# Patient Record
Sex: Male | Born: 1975 | Race: White | Hispanic: No | Marital: Married | Smoking: Current some day smoker
Health system: Southern US, Community
[De-identification: ages and names within clinical notes are randomized; demographics above are authoritative.]

## PROBLEM LIST (undated history)

## (undated) DIAGNOSIS — K219 Gastro-esophageal reflux disease without esophagitis: Secondary | ICD-10-CM

## (undated) HISTORY — DX: Gastro-esophageal reflux disease without esophagitis: K21.9

## (undated) HISTORY — PX: OTHER SURGICAL HISTORY: SHX169

---

## 2016-01-02 DIAGNOSIS — M9904 Segmental and somatic dysfunction of sacral region: Secondary | ICD-10-CM | POA: Diagnosis not present

## 2016-01-02 DIAGNOSIS — M9905 Segmental and somatic dysfunction of pelvic region: Secondary | ICD-10-CM | POA: Diagnosis not present

## 2016-01-02 DIAGNOSIS — M5116 Intervertebral disc disorders with radiculopathy, lumbar region: Secondary | ICD-10-CM | POA: Diagnosis not present

## 2016-01-02 DIAGNOSIS — M9903 Segmental and somatic dysfunction of lumbar region: Secondary | ICD-10-CM | POA: Diagnosis not present

## 2016-05-08 ENCOUNTER — Ambulatory Visit (INDEPENDENT_AMBULATORY_CARE_PROVIDER_SITE_OTHER): Payer: BLUE CROSS/BLUE SHIELD | Admitting: Family Medicine

## 2016-05-08 VITALS — BP 118/80 | HR 79 | Temp 98.2°F | Resp 18 | Ht 75.0 in | Wt 196.0 lb

## 2016-05-08 DIAGNOSIS — R05 Cough: Secondary | ICD-10-CM

## 2016-05-08 DIAGNOSIS — J329 Chronic sinusitis, unspecified: Secondary | ICD-10-CM | POA: Diagnosis not present

## 2016-05-08 DIAGNOSIS — R059 Cough, unspecified: Secondary | ICD-10-CM

## 2016-05-08 DIAGNOSIS — R0982 Postnasal drip: Secondary | ICD-10-CM

## 2016-05-08 MED ORDER — BENZONATATE 100 MG PO CAPS: 100.0000 mg | ORAL_CAPSULE | Freq: Three times a day (TID) | ORAL | 0 refills | Status: DC | PRN

## 2016-05-08 MED ORDER — GUAIFENESIN ER 1200 MG PO TB12: 1.0000 | ORAL_TABLET | Freq: Two times a day (BID) | ORAL | 1 refills | Status: DC | PRN

## 2016-05-08 NOTE — Progress Notes (Signed)
   Patient ID: Adrian Anderson, male    DOB: 1976-01-19, 40 y.o.   MRN: [id number]  PCP: No PCP Per Patient  Chief Complaint  Patient presents with  . Cough    x2 weeks, yellow phlegm     Subjective:   HPI 40 year old presents for evaluation of cough times two weeks. No fever. Reports a history of seasonal allergies in which he was treated with Zyrtec 10 mg.  No runny nose. Headache started about two days ago has not been consistent and reports the pain as mild. Denies sinus pressure.  Soreness of throat twice within the last two weeks.  His most troublesome symptom is cough and he reports it was worst during the day less bothersome at night.  Social History   Social History  . Marital status: Married    Spouse name: N/A  . Number of children: N/A  . Years of education: N/A   Occupational History  . Not on file.   Social History Main Topics  . Smoking status: Never Smoker  . Smokeless tobacco: Never Used  . Alcohol use Yes  . Drug use: No  . Sexual activity: Not on file   Other Topics Concern  . Not on file   Social History Narrative  . No narrative on file    No family history on file.   Review of Systems SEE HPI   There are no active problems to display for this patient.    Prior to Admission medications   Not on File     Allergies  Allergen Reactions  . Dairy Aid [Lactase] Nausea And Vomiting       Objective:  Physical Exam  Constitutional: He is oriented to person, place, and time. He appears well-developed and well-nourished.  HENT:  Head: Normocephalic and atraumatic.  Right Ear: External ear normal.  Left Ear: External ear normal.  Nose: Nose normal.  Mouth/Throat: Oropharynx is clear and moist.  Eyes: Conjunctivae are normal. Pupils are equal, round, and reactive to light.  Erythematous, dry, nares bilaterally.   Neck: Normal range of motion. Neck supple.  Cardiovascular: Normal rate, regular rhythm, normal heart sounds and intact  distal pulses.   Pulmonary/Chest: Effort normal and breath sounds normal.  Musculoskeletal: Normal range of motion.  Neurological: He is alert and oriented to person, place, and time.  Skin: Skin is warm and dry.  Psychiatric: He has a normal mood and affect. His behavior is normal. Judgment and thought content normal.    Vitals:   05/08/16 1243  BP: 118/80  Pulse: 79  Resp: 18  Temp: 98.2 F (36.8 C)     Assessment & Plan:  1. Cough, likely viral and related to post nasal drainage Benzontate 100-200 mg up to 3 times daily.  2. Post-nasal drainage, likely related to seasonal allergies. Guaifenesin 1200 mg twice daily to thin mucus secretions.  Carroll Sage. Kenton Kingfisher, MSN, FNP-C [facility]

## 2016-05-08 NOTE — Patient Instructions (Addendum)
Continue Zyrtec 10 mg at bedtime. Start benzonatate up to 3 times daily as needed for cough. Take Guaifenesin 1200 mg twice daily as needed to thin mucus. Follow-up if symptoms worse or do not resolve.    IF you received an x-ray today, you will receive an invoice from [facility]. Please contact [facility] at [phone] with questions or concerns regarding your invoice.   IF you received labwork today, you will receive an invoice from [employer]. Please contact [facility] at [phone] with questions or concerns regarding your invoice.   Our billing staff will not be able to assist you with questions regarding bills from these companies.  You will be contacted with the lab results as soon as they are available. The fastest way to get your results is to activate your My Chart account. Instructions are located on the last page of this paperwork. If you have not heard from [place] regarding the results in 2 weeks, please contact this office.     Upper Respiratory Infection, Adult Most upper respiratory infections (URIs) are caused by a virus. A URI affects the nose, throat, and upper air passages. The most common type of URI is often called "the common cold." HOME CARE   Take medicines only as told by your doctor.  Gargle warm saltwater or take cough drops to comfort your throat as told by your doctor.  Use a warm mist humidifier or inhale steam from a shower to increase air moisture. This may make it easier to breathe.  Drink enough fluid to keep your pee (urine) clear or pale yellow.  Eat soups and other clear broths.  Have a healthy diet.  Rest as needed.  Go back to work when your fever is gone or your doctor says it is okay.  You may need to stay home longer to avoid giving your URI to others.  You can also wear a face mask and wash your hands often to prevent spread of the virus.  Use your inhaler more if you have asthma.  Do  not use any tobacco products, including cigarettes, chewing tobacco, or electronic cigarettes. If you need help quitting, ask your doctor. GET HELP IF:  You are getting worse, not better.  Your symptoms are not helped by medicine.  You have chills.  You are getting more short of breath.  You have brown or red mucus.  You have yellow or brown discharge from your nose.  You have pain in your face, especially when you bend forward.  You have a fever.  You have puffy (swollen) neck glands.  You have pain while swallowing.  You have white areas in the back of your throat. GET HELP RIGHT AWAY IF:   You have very bad or constant:  Headache.  Ear pain.  Pain in your forehead, behind your eyes, and over your cheekbones (sinus pain).  Chest pain.  You have long-lasting (chronic) lung disease and any of the following:  Wheezing.  Long-lasting cough.  Coughing up blood.  A change in your usual mucus.  You have a stiff neck.  You have changes in your:  Vision.  Hearing.  Thinking.  Mood. MAKE SURE YOU:   Understand these instructions.  Will watch your condition.  Will get help right away if you are not doing well or get worse.   This information is not intended to replace advice given to you by your health care provider. Make sure you discuss any questions you have with  your health care provider.   Document Released: 01/27/2008 Document Revised: 12/25/2014 Document Reviewed: 11/15/2013 Elsevier Interactive Patient Education [employer].

## 2016-06-10 ENCOUNTER — Ambulatory Visit (INDEPENDENT_AMBULATORY_CARE_PROVIDER_SITE_OTHER): Payer: BLUE CROSS/BLUE SHIELD

## 2016-06-10 ENCOUNTER — Encounter: Payer: Self-pay | Admitting: Family Medicine

## 2016-06-10 ENCOUNTER — Ambulatory Visit (INDEPENDENT_AMBULATORY_CARE_PROVIDER_SITE_OTHER): Payer: BLUE CROSS/BLUE SHIELD | Admitting: Family Medicine

## 2016-06-10 VITALS — BP 140/87 | HR 64 | Ht 76.0 in | Wt 195.0 lb

## 2016-06-10 DIAGNOSIS — J8410 Pulmonary fibrosis, unspecified: Secondary | ICD-10-CM

## 2016-06-10 DIAGNOSIS — R079 Chest pain, unspecified: Secondary | ICD-10-CM | POA: Insufficient documentation

## 2016-06-10 DIAGNOSIS — D229 Melanocytic nevi, unspecified: Secondary | ICD-10-CM

## 2016-06-10 DIAGNOSIS — Z0000 Encounter for general adult medical examination without abnormal findings: Secondary | ICD-10-CM

## 2016-06-10 DIAGNOSIS — R5383 Other fatigue: Secondary | ICD-10-CM | POA: Insufficient documentation

## 2016-06-10 DIAGNOSIS — R0789 Other chest pain: Secondary | ICD-10-CM | POA: Insufficient documentation

## 2016-06-10 HISTORY — DX: Melanocytic nevi, unspecified: D22.9

## 2016-06-10 HISTORY — DX: Chest pain, unspecified: R07.9

## 2016-06-10 HISTORY — DX: Other fatigue: R53.83

## 2016-06-10 NOTE — Patient Instructions (Signed)
Thank you for coming in today. Get fasting labs soon.  Get a chest xray today.  Return in the near future to remove the mole on your left shoulder.  I will let you know the results of the labs and xray ASAP.   Fatigue Fatigue is feeling tired all of the time, a lack of energy, or a lack of motivation. Occasional or mild fatigue is often a normal response to activity or life in general. However, long-lasting (chronic) or extreme fatigue may indicate an underlying medical condition. HOME CARE INSTRUCTIONS  Watch your fatigue for any changes. The following actions may help to lessen any discomfort you are feeling:  Talk to your health care provider about how much sleep you need each night. Try to get the required amount every night.  Take medicines only as directed by your health care provider.  Eat a healthy and nutritious diet. Ask your health care provider if you need help changing your diet.  Drink enough fluid to keep your urine clear or pale yellow.  Practice ways of relaxing, such as yoga, meditation, massage therapy, or acupuncture.  Exercise regularly.   Change situations that cause you stress. Try to keep your work and personal routine reasonable.  Do not abuse illegal drugs.  Limit alcohol intake to no more than 1 drink per day for nonpregnant women and 2 drinks per day for men. One drink equals 12 ounces of beer, 5 ounces of wine, or 1 ounces of hard liquor.  Take a multivitamin, if directed by your health care provider. SEEK MEDICAL CARE IF:   Your fatigue does not get better.  You have a fever.   You have unintentional weight loss or gain.  You have headaches.   You have difficulty:   Falling asleep.  Sleeping throughout the night.  You feel angry, guilty, anxious, or sad.   You are unable to have a bowel movement (constipation).   You skin is dry.   Your legs or another part of your body is swollen.  SEEK IMMEDIATE MEDICAL CARE IF:   You  feel confused.   Your vision is blurry.  You feel faint or pass out.   You have a severe headache.   You have severe abdominal, pelvic, or back pain.   You have chest pain, shortness of breath, or an irregular or fast heartbeat.   You are unable to urinate or you urinate less than normal.   You develop abnormal bleeding, such as bleeding from the rectum, vagina, nose, lungs, or nipples.  You vomit blood.   You have thoughts about harming yourself or committing suicide.   You are worried that you might harm someone else.    This information is not intended to replace advice given to you by your health care provider. Make sure you discuss any questions you have with your health care provider.   Document Released: 06/07/2007 Document Revised: 08/31/2014 Document Reviewed: 12/12/2013 Elsevier Interactive Patient Education [employer].
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
## 2016-06-11 ENCOUNTER — Encounter: Payer: Self-pay | Admitting: Family Medicine

## 2016-06-19 DIAGNOSIS — Z0000 Encounter for general adult medical examination without abnormal findings: Secondary | ICD-10-CM | POA: Diagnosis not present

## 2016-06-19 DIAGNOSIS — R5383 Other fatigue: Secondary | ICD-10-CM | POA: Diagnosis not present

## 2016-06-19 DIAGNOSIS — R079 Chest pain, unspecified: Secondary | ICD-10-CM | POA: Diagnosis not present

## 2016-06-19 LAB — CBC
HCT: 44.9 % (ref 38.5–50.0)
Hemoglobin: 15.2 g/dL (ref 13.2–17.1)
MCH: 31 pg (ref 27.0–33.0)
MCHC: 33.9 g/dL (ref 32.0–36.0)
MCV: 91.6 fL (ref 80.0–100.0)
MPV: 10.2 fL (ref 7.5–12.5)
PLATELETS: 192 10*3/uL (ref 140–400)
RBC: 4.9 MIL/uL (ref 4.20–5.80)
RDW: 12.9 % (ref 11.0–15.0)
WBC: 5 10*3/uL (ref 3.8–10.8)

## 2016-06-19 LAB — HEMOGLOBIN A1C
Hgb A1c MFr Bld: 5.1 % (ref <5.7)
MEAN PLASMA GLUCOSE: 100 mg/dL

## 2016-06-20 LAB — TSH: TSH: 1.35 mIU/L (ref 0.40–4.50)

## 2016-06-20 LAB — COMPLETE METABOLIC PANEL WITH GFR
ALT: 17 U/L (ref 9–46)
AST: 18 U/L (ref 10–40)
Albumin: 4.7 g/dL (ref 3.6–5.1)
Alkaline Phosphatase: 61 U/L (ref 40–115)
BILIRUBIN TOTAL: 0.7 mg/dL (ref 0.2–1.2)
BUN: 11 mg/dL (ref 7–25)
CHLORIDE: 103 mmol/L (ref 98–110)
CO2: 25 mmol/L (ref 20–31)
CREATININE: 1.16 mg/dL (ref 0.60–1.35)
Calcium: 9.8 mg/dL (ref 8.6–10.3)
GFR, Est Non African American: 79 mL/min (ref >=60)
GLUCOSE: 90 mg/dL (ref 65–99)
Potassium: 4.2 mmol/L (ref 3.5–5.3)
SODIUM: 139 mmol/L (ref 135–146)
TOTAL PROTEIN: 7.5 g/dL (ref 6.1–8.1)

## 2016-06-20 LAB — IRON AND TIBC
%SAT: 20 % (ref 15–60)
IRON: 72 ug/dL (ref 50–180)
TIBC: 365 ug/dL (ref 250–425)
UIBC: 293 ug/dL (ref 125–400)

## 2016-06-20 LAB — LIPID PANEL
Cholesterol: 232 mg/dL — ABNORMAL HIGH (ref 125–200)
HDL: 50 mg/dL (ref >=40)
LDL Cholesterol: 152 mg/dL — ABNORMAL HIGH (ref <130)
Total CHOL/HDL Ratio: 4.6 Ratio (ref <=5.0)
Triglycerides: 150 mg/dL — ABNORMAL HIGH (ref <150)
VLDL: 30 mg/dL (ref <30)

## 2016-06-20 LAB — VITAMIN D 25 HYDROXY (VIT D DEFICIENCY, FRACTURES): Vit D, 25-Hydroxy: 34 ng/mL (ref 30–100)

## 2016-06-20 LAB — T4, FREE: Free T4: 1.3 ng/dL (ref 0.8–1.8)

## 2016-06-20 LAB — TESTOSTERONE: Testosterone: 680 ng/dL (ref 250–827)

## 2016-06-20 LAB — T3, FREE: T3 FREE: 3.4 pg/mL (ref 2.3–4.2)

## 2016-06-20 LAB — FERRITIN: FERRITIN: 49 ng/mL (ref 20–345)

## 2016-08-05 ENCOUNTER — Ambulatory Visit: Payer: BLUE CROSS/BLUE SHIELD | Admitting: Family Medicine

## 2017-01-30 ENCOUNTER — Encounter: Payer: Self-pay | Admitting: Emergency Medicine

## 2017-01-30 ENCOUNTER — Emergency Department (INDEPENDENT_AMBULATORY_CARE_PROVIDER_SITE_OTHER): Payer: BLUE CROSS/BLUE SHIELD

## 2017-01-30 ENCOUNTER — Emergency Department
Admission: EM | Admit: 2017-01-30 | Discharge: 2017-01-30 | Disposition: A | Discharge status: Home / Self Care | Payer: BLUE CROSS/BLUE SHIELD | Source: Home / Self Care | Attending: Family Medicine | Admitting: Family Medicine

## 2017-01-30 DIAGNOSIS — M25511 Pain in right shoulder: Secondary | ICD-10-CM | POA: Diagnosis not present

## 2017-01-30 DIAGNOSIS — S4991XA Unspecified injury of right shoulder and upper arm, initial encounter: Secondary | ICD-10-CM | POA: Diagnosis not present

## 2017-01-30 DIAGNOSIS — S4351XA Sprain of right acromioclavicular joint, initial encounter: Secondary | ICD-10-CM

## 2017-01-30 DIAGNOSIS — S80211A Abrasion, right knee, initial encounter: Secondary | ICD-10-CM

## 2017-01-30 DIAGNOSIS — S40211A Abrasion of right shoulder, initial encounter: Secondary | ICD-10-CM

## 2017-01-30 MED ORDER — HYDROCODONE-ACETAMINOPHEN 5-325 MG PO TABS: 1.0000 | ORAL_TABLET | Freq: Four times a day (QID) | ORAL | 0 refills | Status: DC | PRN

## 2017-01-30 MED ORDER — MELOXICAM 15 MG PO TABS: 15.0000 mg | ORAL_TABLET | Freq: Every day | ORAL | 0 refills | Status: DC
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
## 2017-01-30 NOTE — Discharge Instructions (Signed)
Apply ice pack for 20 to 30 minutes, 3 to 4 times daily  Continue until pain and swelling decrease.  Begin range of motion and stretching exercises as tolerated.  Change bandages daily and apply Bacitracin ointment to wounds until healed.  Keep wounds clean and dry.
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
## 2017-02-12 ENCOUNTER — Ambulatory Visit (INDEPENDENT_AMBULATORY_CARE_PROVIDER_SITE_OTHER): Payer: BLUE CROSS/BLUE SHIELD | Admitting: Family Medicine

## 2017-02-12 ENCOUNTER — Encounter: Payer: Self-pay | Admitting: Family Medicine

## 2017-02-12 DIAGNOSIS — S43004A Unspecified dislocation of right shoulder joint, initial encounter: Secondary | ICD-10-CM | POA: Diagnosis not present

## 2017-02-12 HISTORY — DX: Unspecified dislocation of right shoulder joint, initial encounter: S43.004A
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
## 2017-02-12 NOTE — Patient Instructions (Addendum)
Thank you for coming in today. Return as needed if not better.  Return as needed.    Shoulder Separation A shoulder separation (acromioclavicular separation) is an injury to the connecting tissue (ligament) between the top of your shoulder blade (acromion) and your collarbone (clavicle). The ligament may be stretched, partially torn, or completely torn.  A stretched ligament may not cause very much pain, and it does not move the collarbone out of place. A stretched ligament looks normal on an X-ray.  An injury that is a bit worse may partially tear a ligament and move the collarbone slightly out of place.  A serious injury completely tears both shoulder ligaments. This moves the collarbone severely out of position and changes the way that the shoulder looks (deformity).  What are the causes? The most common cause of a shoulder separation is falling on or receiving a blow to the top of the shoulder. Falling with an outstretched arm may also cause this injury. What increases the risk? You may be at greater risk of a shoulder separation if:  You are male.  You are younger than age 32.  You play a contact sport, such as football or hockey.  What are the signs or symptoms? The most common symptom of a shoulder separation is pain on the top of the shoulder after falling on it or receiving a blow to it. Other signs and symptoms include:  Shoulder deformity.  Swelling of the shoulder.  Decreased ability to move the shoulder.  Bruising on top of the shoulder.  How is this diagnosed? Your health care provider may suspect a shoulder separation based on your symptoms and the details of a recent injury. A physical exam will be done. During this exam, the health care provider may:  Press on your shoulder.  Test the movement of your shoulder.  Ask you to hold a weight in your hand to see if the separation increases.  Do an X-ray.  How is this treated?  A stretch injury may require  only a sling, pain medicine, and cold packs. This treatment may last for 2-12 weeks. You may also have physical therapy. A physical therapist will teach you to do daily exercises to strengthen your shoulder muscles and prevent stiffness.  A complete tear may require surgery to repair the torn ligament. After surgery, you will also require a sling, pain medicine, and cold packs. Recovery may take longer. You may also need more physical therapy. Follow these instructions at home:  Take medicines only as directed by your health care provider.  Apply ice to the top of your shoulder: ? Put ice in a plastic bag. ? Place a towel between your skin and the bag. ? Leave the ice on for 20 minutes, 2-3 times a day.  Wear your sling or splint as directed by your health care provider. ? You may be able to remove your sling to do your physical therapy exercises. ? Ask your health care provider when you can stop wearing the sling.  Do not do any activities that make your pain worse.  Do not lift anything that is heavier than 10 lb (4.5 kg) on the injured side of your body.  Ask your health care provider when you can return to athletic activities. Contact a health care provider if:  Your pain medicine is not relieving your pain.  Your pain and stiffness are not improving after 2 weeks.  You are unable to do your physical therapy exercises because of pain  or stiffness. This information is not intended to replace advice given to you by your health care provider. Make sure you discuss any questions you have with your health care provider. Document Released: 05/20/2005 Document Revised: 04/05/2016 Document Reviewed: 01/10/2014 Elsevier Interactive Patient Education  [employer].

## 2017-02-22 ENCOUNTER — Ambulatory Visit: Payer: Self-pay | Admitting: Family Medicine

## 2017-04-14 ENCOUNTER — Emergency Department
Admission: EM | Admit: 2017-04-14 | Discharge: 2017-04-14 | Disposition: A | Discharge status: Home / Self Care | Payer: BLUE CROSS/BLUE SHIELD | Source: Home / Self Care

## 2017-04-14 ENCOUNTER — Encounter: Payer: Self-pay | Admitting: *Deleted

## 2017-04-14 DIAGNOSIS — J029 Acute pharyngitis, unspecified: Secondary | ICD-10-CM | POA: Diagnosis not present

## 2017-04-14 LAB — POCT RAPID STREP A (OFFICE): Rapid Strep A Screen: NEGATIVE

## 2017-04-14 NOTE — ED Triage Notes (Signed)
Pt c/o sore throat and HA x 2 days. Denies fever.

## 2017-04-14 NOTE — ED Provider Notes (Signed)
[facility]    CSN: [id number] Arrival date & time: 04/14/17  1342     History   Chief Complaint Chief Complaint  Patient presents with  . Sore Throat    HPI Davit Vassar is a 41 y.o. male.   HPI Verlie Hellenbrand is a 41 y.o. male presenting to UC with c/o sore throat and generalized headache for 2 days.  Pain is mild at this time but worse with swallowing. Denies fever, chills, cough or congestion.  He has been traveling a lot for work recently and wants to make sure he does not have strep.    History reviewed. No pertinent past medical history.  Patient Active Problem List   Diagnosis Date Noted  . Shoulder separation, right, initial encounter 02/12/2017  . Chest pain 06/10/2016  . Fatigue 06/10/2016  . Nevus 06/10/2016    Past Surgical History:  Procedure Laterality Date  . 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 Authorizing Provider  HYDROcodone-acetaminophen (NORCO/VICODIN) 5-325 MG tablet Take 1 tablet by mouth every 6 (six) hours as needed for moderate pain. 01/30/17   Kandra Nicolas, MD  meloxicam (MOBIC) 15 MG tablet Take 1 tablet (15 mg total) by mouth daily. Take with food. 01/30/17   Kandra Nicolas, MD    Family History Family History  Problem Relation Age of Onset  . Hypertension Mother   . Cancer Father     Social History Social History  Substance Use Topics  . Smoking status: Never Smoker  . Smokeless tobacco: Never Used  . Alcohol use Yes     Comment: socially      Allergies   Dairy aid [lactase]   Review of Systems Review of Systems  Constitutional: Negative for chills and fever.  HENT: Positive for sore throat. Negative for congestion, ear pain, trouble swallowing and voice change.   Respiratory: Negative for cough and shortness of breath.   Cardiovascular: Negative for chest pain and palpitations.    Gastrointestinal: Negative for abdominal pain, diarrhea, nausea and vomiting.  Musculoskeletal: Negative for arthralgias, back pain and myalgias.  Skin: Negative for rash.  Neurological: Positive for headaches. Negative for dizziness and light-headedness.     Physical Exam Triage Vital Signs ED Triage Vitals  Enc Vitals Group     BP 04/14/17 1417 137/80     Pulse Rate 04/14/17 1417 66     Resp 04/14/17 1417 16     Temp 04/14/17 1417 98 F (36.7 C)     Temp Source 04/14/17 1417 Oral     SpO2 04/14/17 1417 98 %     Weight 04/14/17 1418 190 lb (86.2 kg)     Height 04/14/17 1418 6\' 5"  (1.956 m)     Head Circumference --      Peak Flow --      Pain Score 04/14/17 1418 0     Pain Loc --      Pain Edu? --      Excl. in [place]? --    No data found.   Updated Vital Signs BP 137/80 (BP Location: Left Arm)   Pulse 66   Temp 98 F (36.7 C) (Oral)   Resp 16   Ht 6\' 5"  (1.956 m)   Wt 190 lb (86.2 kg)   SpO2 98%   BMI 22.53 kg/m  Visual Acuity Right Eye Distance:   Left Eye Distance:   Bilateral Distance:    Right Eye Near:   Left Eye Near:    Bilateral Near:     Physical Exam  Constitutional: He is oriented to person, place, and time. He appears well-developed and well-nourished. No distress.  HENT:  Head: Normocephalic and atraumatic.  Right Ear: Tympanic membrane normal.  Left Ear: Tympanic membrane normal.  Nose: Nose normal.  Mouth/Throat: Uvula is midline and mucous membranes are normal. Posterior oropharyngeal erythema present. No oropharyngeal exudate, posterior oropharyngeal edema or tonsillar abscesses.  Eyes: EOM are normal.  Neck: Normal range of motion. Neck supple.  Cardiovascular: Normal rate and regular rhythm.   Pulmonary/Chest: Effort normal and breath sounds normal. No stridor. No respiratory distress. He has no wheezes. He has no rales.  Musculoskeletal: Normal range of motion.  Lymphadenopathy:    He has no cervical adenopathy.  Neurological: He  is alert and oriented to person, place, and time.  Skin: Skin is warm and dry. He is not diaphoretic.  Psychiatric: He has a normal mood and affect. His behavior is normal.  Nursing note and vitals reviewed.    UC Treatments / Results  Labs (all labs ordered are listed, but only abnormal results are displayed) Labs Reviewed  STREP A DNA PROBE  POCT RAPID STREP A (OFFICE)    EKG  EKG Interpretation None       Radiology No results found.  Procedures Procedures (including critical care time)  Medications Ordered in UC Medications - No data to display   Initial Impression / Assessment and Plan / UC Course  I have reviewed the triage vital signs and the nursing notes.  Pertinent labs & imaging results that were available during my care of the patient were reviewed by me and considered in my medical decision making (see chart for details).     Pt c/o generalized headache and mild sore throat. Rapid strep: NEGATIVE Culture sent given lack of other URI symptoms.  Final Clinical Impressions(s) / UC Diagnoses   Final diagnoses:  Pharyngitis, unspecified etiology   Encouraged symptomatic treatment of fluids, rest, acetaminophen, and ibuprofen F/u with PCP in 1 week as needed.   New Prescriptions Discharge Medication List as of 04/14/2017  2:23 PM       Controlled Substance Prescriptions [place] Controlled Substance Registry consulted? Not Applicable   Tyrell Antonio 04/14/17 1556

## 2017-04-14 NOTE — Discharge Instructions (Signed)
°  You may take 500mg acetaminophen every 4-6 hours or in combination with ibuprofen 400-600mg every 6-8 hours as needed for pain, inflammation, and fever. ° °Be sure to drink at least eight 8oz glasses of water to stay well hydrated and get at least 8 hours of sleep at night, preferably more while sick.  ° °
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
## 2019-10-06 ENCOUNTER — Ambulatory Visit: Payer: BC Managed Care – PPO | Attending: Internal Medicine

## 2019-10-06 DIAGNOSIS — Z20822 Contact with and (suspected) exposure to covid-19: Secondary | ICD-10-CM | POA: Diagnosis not present

## 2019-10-08 LAB — NOVEL CORONAVIRUS, NAA: SARS-CoV-2, NAA: NOT DETECTED

## 2019-11-08 ENCOUNTER — Telehealth (INDEPENDENT_AMBULATORY_CARE_PROVIDER_SITE_OTHER): Payer: BC Managed Care – PPO | Admitting: Family Medicine

## 2019-11-08 ENCOUNTER — Encounter: Payer: Self-pay | Admitting: Family Medicine

## 2019-11-08 DIAGNOSIS — R1013 Epigastric pain: Secondary | ICD-10-CM

## 2019-11-08 HISTORY — DX: Epigastric pain: R10.13

## 2019-11-08 MED ORDER — PANTOPRAZOLE SODIUM 40 MG PO TBEC: 40.0000 mg | DELAYED_RELEASE_TABLET | Freq: Every day | ORAL | 0 refills | Status: DC

## 2019-11-08 NOTE — Progress Notes (Signed)
Called patient to start the visit and it went to VM. This is the first attempt.  Stomach pain comes and goes, 2 weeks on and off, He has had this in the past and it was coming and going.  Denies N/V, Avoids dairy.  Eat natural Yogurt,  Trying a bland diet Herbal tea.  [place] food and salty helps some as well.  1 or 2 weeks ago he felt nauseated had an episode and took Theraflu and thought he may have.

## 2019-11-08 NOTE — Progress Notes (Signed)
Adrian Anderson - 44 y.o. male MRN [id number]  Date of birth: 1976-06-24   This visit type was conducted due to national recommendations for restrictions regarding the COVID-19 Pandemic (e.g. social distancing).  This format is felt to be most appropriate for this patient at this time.  All issues noted in this document were discussed and addressed.  No physical exam was performed (except for noted visual exam findings with Video Visits).  I discussed the limitations of evaluation and management by telemedicine and the availability of in person appointments. The patient expressed understanding and agreed to proceed.  I connected with@ on 11/08/19 at 10:10 AM EDT by a video enabled telemedicine application and verified that I am speaking with the correct person using two identifiers.  Present at visit: Luetta Nutting, DO Molly Maduro   Patient Location: Home [place] [address]   Provider location:   [facility]  Chief Complaint  Patient presents with  . Abdominal Pain    HPI  Adrian Anderson is a 44 y.o. male who presents via audio/video conferencing for a telehealth visit today.  He has complaint of abdominal pain.  Pain is located in epigastric area.  Symptoms started about 2 weeks ago.  He has had some nausea without vomiting.  Eating tends to improve symptoms. He does admit to increased EtOH and coffee intake during recent business trip before symptoms started.  He has tried ibuprofen and theraflu without much improvement.    ROS:  A comprehensive ROS was completed and negative except as noted per HPI  No past medical history on file.  Past Surgical History:  Procedure Laterality Date  . 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 Hypertension Mother   . Cancer Father     Social History   Socioeconomic History  . Marital status: Married    Spouse name: Not on file  . Number of children: Not on file  .  Years of education: Not on file  . Highest education level: Not on file  Occupational History  . Not on file  Tobacco Use  . Smoking status: Never Smoker  . Smokeless tobacco: Never Used  Substance and Sexual Activity  . Alcohol use: Yes    Comment: socially   . Drug use: No  . Sexual activity: Yes    Partners: Female  Other Topics Concern  . Not on file  Social History Narrative  . Not on file   Social Determinants of Health   Financial Resource Strain:   . Difficulty of Paying Living Expenses:   Food Insecurity:   . Worried About Charity fundraiser in the Last Year:   . Arboriculturist in the Last Year:   Transportation Needs:   . Film/video editor (Medical):   Marland Kitchen Lack of Transportation (Non-Medical):   Physical Activity:   . Days of Exercise per Week:   . Minutes of Exercise per Session:   Stress:   . Feeling of Stress :   Social Connections:   . Frequency of Communication with Friends and Family:   . Frequency of Social Gatherings with Friends and Family:   . Attends Religious Services:   . Active Member of Clubs or Organizations:   . Attends Archivist Meetings:   Marland Kitchen Marital Status:   Intimate Partner Violence:   . Fear of Current or Ex-Partner:   .  Emotionally Abused:   Marland Kitchen Physically Abused:   . Sexually Abused:      Current Outpatient Medications:  .  ibuprofen (ADVIL) 100 MG tablet, Take 100 mg by mouth every 8 (eight) hours as needed for pain or fever., Disp: , Rfl:  .  pantoprazole (PROTONIX) 40 MG tablet, Take 1 tablet (40 mg total) by mouth daily., Disp: 30 tablet, Rfl: 0  EXAM:  VITALS per patient if applicable: BP 123XX123   Ht 6\' 4"  (1.93 m)   Wt 190 lb (86.2 kg)   BMI 23.13 kg/m   GENERAL: alert, oriented, appears well and in no acute distress  HEENT: atraumatic, conjunttiva clear, no obvious abnormalities on inspection of external nose and ears  NECK: normal movements of the head and neck  LUNGS: on inspection no signs of  respiratory distress, breathing rate appears normal, no obvious gross SOB, gasping or wheezing  CV: no obvious cyanosis  MS: moves all visible extremities without noticeable abnormality  PSYCH/NEURO: pleasant and cooperative, no obvious depression or anxiety, speech and thought processing grossly intact  ASSESSMENT AND PLAN:  Discussed the following assessment and plan:  Epigastric pain Current symptoms consistent with gastritis.  Start protonix 40mg  daily.  Avoid NSAIDS and limit caffeine/coffee and EtOH for now.  Follow up in 2 weeks to assess for improvement.  If not improving will check H. Pylori.   Meds ordered this encounter  Medications  . pantoprazole (PROTONIX) 40 MG tablet    Sig: Take 1 tablet (40 mg total) by mouth daily.    Dispense:  30 tablet    Refill:  0   30 minutes spent including pre visit preparation, review of prior notes and labs, encounter with patient via video visit and same day documentation.  I discussed the assessment and treatment plan with the patient. The patient was provided an opportunity to ask questions and all were answered. The patient agreed with the plan and demonstrated an understanding of the instructions.   The patient was advised to call back or seek an in-person evaluation if the symptoms worsen or if the condition fails to improve as anticipated.    Luetta Nutting, DO

## 2019-11-08 NOTE — Assessment & Plan Note (Signed)
Current symptoms consistent with gastritis.  Start protonix 40mg  daily.  Avoid NSAIDS and limit caffeine/coffee and EtOH for now.  Follow up in 2 weeks to assess for improvement.  If not improving will check H. Pylori.

## 2020-05-22 DIAGNOSIS — Z20822 Contact with and (suspected) exposure to covid-19: Secondary | ICD-10-CM | POA: Diagnosis not present

## 2020-06-24 DIAGNOSIS — R6882 Decreased libido: Secondary | ICD-10-CM

## 2020-06-24 HISTORY — DX: Decreased libido: R68.82

## 2020-08-27 ENCOUNTER — Other Ambulatory Visit: Payer: Self-pay | Admitting: Family Medicine

## 2020-08-27 ENCOUNTER — Encounter: Payer: Self-pay | Admitting: Family Medicine

## 2020-08-27 DIAGNOSIS — R6882 Decreased libido: Secondary | ICD-10-CM

## 2020-08-27 DIAGNOSIS — Z0000 Encounter for general adult medical examination without abnormal findings: Secondary | ICD-10-CM

## 2020-08-27 DIAGNOSIS — R5383 Other fatigue: Secondary | ICD-10-CM

## 2020-08-27 DIAGNOSIS — Z13220 Encounter for screening for lipoid disorders: Secondary | ICD-10-CM

## 2020-08-29 LAB — CBC: HCT: 42.2 % (ref 38.5–50.0)

## 2020-08-29 LAB — COMPLETE METABOLIC PANEL WITH GFR
BUN: 15 mg/dL (ref 7–25)
Glucose, Bld: 92 mg/dL (ref 65–99)

## 2020-08-29 LAB — LIPID PANEL: HDL: 52 mg/dL (ref >=40)

## 2020-08-30 LAB — VITAMIN D 25 HYDROXY (VIT D DEFICIENCY, FRACTURES): Vit D, 25-Hydroxy: 45 ng/mL (ref 30–100)

## 2020-08-30 LAB — LIPID PANEL
Cholesterol: 210 mg/dL — ABNORMAL HIGH (ref <200)
LDL Cholesterol (Calc): 136 mg/dL (calc) — ABNORMAL HIGH
Non-HDL Cholesterol (Calc): 158 mg/dL (calc) — ABNORMAL HIGH (ref <130)
Total CHOL/HDL Ratio: 4 (calc) (ref <5.0)
Triglycerides: 116 mg/dL (ref <150)

## 2020-08-30 LAB — COMPLETE METABOLIC PANEL WITH GFR
AG Ratio: 1.8 (calc) (ref 1.0–2.5)
ALT: 13 U/L (ref 9–46)
AST: 19 U/L (ref 10–40)
Albumin: 4.5 g/dL (ref 3.6–5.1)
Alkaline phosphatase (APISO): 69 U/L (ref 36–130)
CO2: 33 mmol/L — ABNORMAL HIGH (ref 20–32)
Calcium: 9.6 mg/dL (ref 8.6–10.3)
Chloride: 98 mmol/L (ref 98–110)
Creat: 1.17 mg/dL (ref 0.60–1.35)
GFR, Est African American: 87 mL/min/{1.73_m2} (ref >=60)
GFR, Est Non African American: 75 mL/min/{1.73_m2} (ref >=60)
Globulin: 2.5 g/dL (calc) (ref 1.9–3.7)
Potassium: 4.2 mmol/L (ref 3.5–5.3)
Sodium: 137 mmol/L (ref 135–146)
Total Bilirubin: 0.8 mg/dL (ref 0.2–1.2)
Total Protein: 7 g/dL (ref 6.1–8.1)

## 2020-08-30 LAB — CBC
Hemoglobin: 14.5 g/dL (ref 13.2–17.1)
MCH: 31.6 pg (ref 27.0–33.0)
MCHC: 34.4 g/dL (ref 32.0–36.0)
MCV: 91.9 fL (ref 80.0–100.0)
MPV: 9.6 fL (ref 7.5–12.5)
Platelets: 188 10*3/uL (ref 140–400)
RBC: 4.59 10*6/uL (ref 4.20–5.80)
RDW: 11.9 % (ref 11.0–15.0)
WBC: 4.3 10*3/uL (ref 3.8–10.8)

## 2020-08-30 LAB — VITAMIN B12: Vitamin B-12: 506 pg/mL (ref 200–1100)

## 2020-08-30 LAB — TSH: TSH: 1.14 mIU/L (ref 0.40–4.50)

## 2020-08-30 LAB — TESTOSTERONE: Testosterone: 566 ng/dL (ref 250–827)

## 2020-09-03 ENCOUNTER — Other Ambulatory Visit: Payer: Self-pay

## 2020-09-03 ENCOUNTER — Ambulatory Visit (INDEPENDENT_AMBULATORY_CARE_PROVIDER_SITE_OTHER): Payer: BC Managed Care – PPO | Admitting: Family Medicine

## 2020-09-03 ENCOUNTER — Encounter: Payer: Self-pay | Admitting: Family Medicine

## 2020-09-03 VITALS — BP 136/77 | HR 70 | Temp 97.6°F | Ht 76.97 in | Wt 197.0 lb

## 2020-09-03 DIAGNOSIS — J310 Chronic rhinitis: Secondary | ICD-10-CM

## 2020-09-03 DIAGNOSIS — J342 Deviated nasal septum: Secondary | ICD-10-CM | POA: Diagnosis not present

## 2020-09-03 DIAGNOSIS — Z0000 Encounter for general adult medical examination without abnormal findings: Secondary | ICD-10-CM

## 2020-09-03 DIAGNOSIS — R6882 Decreased libido: Secondary | ICD-10-CM | POA: Diagnosis not present

## 2020-09-03 HISTORY — DX: Encounter for general adult medical examination without abnormal findings: Z00.00

## 2020-09-03 HISTORY — DX: Chronic rhinitis: J31.0

## 2020-09-03 MED ORDER — TADALAFIL 5 MG PO TABS: 5.0000 mg | ORAL_TABLET | ORAL | 6 refills | Status: DC | PRN

## 2020-09-03 NOTE — Assessment & Plan Note (Signed)
Referral placed to ENT for deviated septum

## 2020-09-03 NOTE — Patient Instructions (Signed)

## 2020-09-03 NOTE — Progress Notes (Signed)
[name] - 45 y.o. male MRN [id number]  Date of birth: 18-Dec-1975  Subjective Chief Complaint  Patient presents with  . Annual Exam    HPI Adrian Anderson is a 45 y.o. male here today for annual exam.  He has been in good health.  Has concerns today about decreased libido.  Recent labs including testosterone levels were normal.  He reports that he is currently going through a divorce and this may be contributing to his symptoms.    Also reports chronic rhinitis and congestion.  Has tried sinus rinses with mild improvement.   I reviewed his recent labs with him.  Cholesterol mildly elevated.  Discussed dietary changes.  He remains active and cycles for exercise.   He is a non-smoker. He consumes EtOH socially.   He has had COVID vaccine and booster He has had flu vaccine.   Review of Systems  Constitutional: Negative for chills, fever, malaise/fatigue and weight loss.  HENT: Negative for congestion, ear pain and sore throat.   Eyes: Negative for blurred vision, double vision and pain.  Respiratory: Negative for cough and shortness of breath.   Cardiovascular: Negative for chest pain and palpitations.  Gastrointestinal: Negative for abdominal pain, blood in stool, constipation, heartburn and nausea.  Genitourinary: Negative for dysuria and urgency.  Musculoskeletal: Negative for joint pain and myalgias.  Neurological: Negative for dizziness and headaches.  Endo/Heme/Allergies: Does not bruise/bleed easily.  Psychiatric/Behavioral: Negative for depression. The patient is not nervous/anxious and does not have insomnia.     Allergies  Allergen Reactions  . Dairy Aid [Lactase] Nausea And Vomiting    History reviewed. No pertinent past medical history.  Past Surgical History:  Procedure Laterality Date  . 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 Marital status: Married    Spouse name: Not on file  . Number of  children: Not on file  . Years of education: Not on file  . Highest education level: Not on file  Occupational History  . Not on file  Tobacco Use  . Smoking status: Never Smoker  . Smokeless tobacco: Never Used  Vaping Use  . Vaping Use: Never used  Substance and Sexual Activity  . Alcohol use: Yes    Comment: socially   . Drug use: No  . Sexual activity: Yes    Partners: Female  Other Topics Concern  . Not on file  Social History Narrative  . Not on file   Social Determinants of Health   Financial Resource Strain: Not on file  Food Insecurity: Not on file  Transportation Needs: Not on file  Physical Activity: Not on file  Stress: Not on file  Social Connections: Not on file    Family History  Problem Relation Age of Onset  . Hypertension Mother   . Cancer Father     Health Maintenance  Topic Date Due  . Hepatitis C Screening  Never done  . TETANUS/TDAP  08/24/2022  . COVID-19 Vaccine  Completed  . HIV Screening  Completed  . INFLUENZA VACCINE  Discontinued     ----------------------------------------------------------------------------------------------------------------------------------------------------------------------------------------------------------------- Physical Exam BP 136/77 (BP Location: Left Arm, Patient Position: Sitting, Cuff Size: Normal)   Pulse 70   Temp 97.6 F (36.4 C)   Ht 6' 4.97" (1.955 m)   Wt 197 lb (89.4 kg)   SpO2 100%   BMI 23.38 kg/m   Physical Exam Constitutional:  General: He is not in acute distress.    Appearance: He is well-nourished.  HENT:     Head: Normocephalic and atraumatic.     Right Ear: External ear normal.     Left Ear: External ear normal.     Nose:     Comments: Septal deviation to the Right.     Mouth/Throat:     Mouth: Oropharynx is clear and moist.  Eyes:     General: No scleral icterus. Neck:     Thyroid: No thyromegaly.  Cardiovascular:     Rate and Rhythm: Normal rate and regular  rhythm.     Pulses: Intact distal pulses.     Heart sounds: Normal heart sounds.  Pulmonary:     Effort: Pulmonary effort is normal.     Breath sounds: Normal breath sounds.  Abdominal:     General: Bowel sounds are normal. There is no distension.     Palpations: Abdomen is soft.     Tenderness: There is no abdominal tenderness. There is no guarding.  Musculoskeletal:        General: No edema.     Cervical back: Normal range of motion.  Lymphadenopathy:     Cervical: No cervical adenopathy.  Skin:    General: Skin is warm and dry.     Findings: No rash.  Neurological:     Mental Status: He is alert and oriented to person, place, and time.     Cranial Nerves: No cranial nerve deficit.     Motor: No abnormal muscle tone.  Psychiatric:        Mood and Affect: Mood and affect normal.        Behavior: Behavior normal.     ------------------------------------------------------------------------------------------------------------------------------------------------------------------------------------------------------------------- Assessment and Plan  Libido, decreased Discussed trial of tadalafil as needed.   Side effects and red flags reviewed including prolonged erection and avoidance of nitrates.    Well adult exam Well adult Recent labs reviewed with him.  Immunizations: UTD Screenings: UTD Anticipatory guidance/Risk factor reduction: Recommendations per AVS  Chronic rhinitis Referral placed to ENT for deviated septum   Meds ordered this encounter  Medications  . tadalafil (CIALIS) 5 MG tablet    Sig: Take 1-2 tablets (5-10 mg total) by mouth every other day as needed for erectile dysfunction. Using [place] coupon    Dispense:  30 tablet    Refill:  6    No follow-ups on file.    This visit occurred during the SARS-CoV-2 public health emergency.  Safety protocols were in place, including screening questions prior to the visit, additional usage of staff PPE, and  extensive cleaning of exam room while observing appropriate contact time as indicated for disinfecting solutions.

## 2020-09-03 NOTE — Assessment & Plan Note (Signed)
Discussed trial of tadalafil as needed.   Side effects and red flags reviewed including prolonged erection and avoidance of nitrates.

## 2020-09-03 NOTE — Assessment & Plan Note (Signed)
Well adult Recent labs reviewed with him.  Immunizations: UTD Screenings: UTD Anticipatory guidance/Risk factor reduction:  Recommendations per AVS.  

## 2020-09-20 ENCOUNTER — Ambulatory Visit (INDEPENDENT_AMBULATORY_CARE_PROVIDER_SITE_OTHER): Payer: BC Managed Care – PPO | Admitting: Otolaryngology

## 2020-09-20 ENCOUNTER — Encounter (INDEPENDENT_AMBULATORY_CARE_PROVIDER_SITE_OTHER): Payer: Self-pay | Admitting: Otolaryngology

## 2020-09-20 ENCOUNTER — Other Ambulatory Visit: Payer: Self-pay

## 2020-09-20 VITALS — Temp 97.2°F

## 2020-09-20 DIAGNOSIS — J342 Deviated nasal septum: Secondary | ICD-10-CM | POA: Diagnosis not present

## 2020-09-20 DIAGNOSIS — M950 Acquired deformity of nose: Secondary | ICD-10-CM

## 2020-09-20 NOTE — Progress Notes (Signed)
HPI: Adrian Anderson is a 45 y.o. male who presents is referred by his PCP for evaluation of nasal obstruction.  Patient states that he has always had trouble breathing through his nose worse on the right side.  This is been going on for years.  He also does not like the way his nose looks as he has of a large nose.  If he has any surgery on the nose to improve it's function he is also interested in having cosmetic work on the nose to make it smaller.  No past medical history on file. Past Surgical History:  Procedure Laterality Date  . 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 Marital status: Married    Spouse name: Not on file  . Number of children: Not on file  . Years of education: Not on file  . Highest education level: Not on file  Occupational History  . Not on file  Tobacco Use  . Smoking status: Current Some Day Smoker  . Smokeless tobacco: Never Used  Vaping Use  . Vaping Use: Never used  Substance and Sexual Activity  . Alcohol use: Yes    Comment: socially   . Drug use: No  . Sexual activity: Yes    Partners: Female  Other Topics Concern  . Not on file  Social History Narrative  . Not on file   Social Determinants of Health   Financial Resource Strain: Not on file  Food Insecurity: Not on file  Transportation Needs: Not on file  Physical Activity: Not on file  Stress: Not on file  Social Connections: Not on file   Family History  Problem Relation Age of Onset  . Hypertension Mother   . Cancer Father    Allergies  Allergen Reactions  . Dairy Aid [Lactase] Nausea And Vomiting   Prior to Admission medications   Medication Sig Start Date End Date Taking? Authorizing Provider  tadalafil (CIALIS) 5 MG tablet Take 1-2 tablets (5-10 mg total) by mouth every other day as needed for erectile dysfunction. Using [employer] coupon 09/03/20   Luetta Nutting, DO     Positive ROS: Otherwise negative  All other  systems have been reviewed and were otherwise negative with the exception of those mentioned in the HPI and as above.  Physical Exam: Constitutional: Alert, well-appearing, no acute distress Ears: External ears without lesions or tenderness. Ear canals are clear bilaterally with intact, clear TMs.  Nasal: External nose without lesions, intranasally he has a rather significant septal deviation with the caudal edge of the septum protruding into the right nostril.  He also has slight nasal valve collapse on inhalation.  He has very prominent upper lateral nasal cartilage as well as deviation of the nasal bones to the right.  He has just a mild nasal dorsal hump but a protuberant nose and large columella. Oral: Lips and gums without lesions. Tongue and palate mucosa without lesions. Posterior oropharynx clear. Neck: No palpable adenopathy or masses Respiratory: Breathing comfortably  Skin: No facial/neck lesions or rash noted.  Procedures  Assessment: Nasal septal deviation Large nose with prominent upper lateral cartilages and slight deviation of the nose externally.  Plan: Reviewed with patient concerning septoplasty and turbinate reduction to help improve the nose functionally as he should get good response with this.  Concerning the cosmetic portion of the surgery to make the nose "smaller" this will be a little more difficult and discussed  possibly obtaining a second opinion from [facility] at [facility], Mikki Santee, MD.   Radene Journey, MD   CC:

## 2020-10-31 DIAGNOSIS — J342 Deviated nasal septum: Secondary | ICD-10-CM | POA: Diagnosis not present

## 2020-10-31 DIAGNOSIS — J3489 Other specified disorders of nose and nasal sinuses: Secondary | ICD-10-CM | POA: Diagnosis not present

## 2020-10-31 DIAGNOSIS — M950 Acquired deformity of nose: Secondary | ICD-10-CM | POA: Diagnosis not present

## 2021-01-23 ENCOUNTER — Encounter: Payer: Self-pay | Admitting: Family Medicine

## 2021-06-24 HISTORY — PX: RHINOPLASTY: SUR1284

## 2021-06-30 DIAGNOSIS — Z8781 Personal history of (healed) traumatic fracture: Secondary | ICD-10-CM | POA: Diagnosis not present

## 2021-06-30 DIAGNOSIS — Z411 Encounter for cosmetic surgery: Secondary | ICD-10-CM | POA: Diagnosis not present

## 2021-06-30 DIAGNOSIS — J343 Hypertrophy of nasal turbinates: Secondary | ICD-10-CM | POA: Diagnosis not present

## 2021-06-30 DIAGNOSIS — J3489 Other specified disorders of nose and nasal sinuses: Secondary | ICD-10-CM | POA: Diagnosis not present

## 2021-06-30 DIAGNOSIS — J342 Deviated nasal septum: Secondary | ICD-10-CM | POA: Diagnosis not present

## 2021-06-30 DIAGNOSIS — M950 Acquired deformity of nose: Secondary | ICD-10-CM | POA: Diagnosis not present

## 2021-09-06 ENCOUNTER — Other Ambulatory Visit: Payer: Self-pay | Admitting: Family Medicine

## 2021-11-06 DIAGNOSIS — J3489 Other specified disorders of nose and nasal sinuses: Secondary | ICD-10-CM | POA: Diagnosis not present

## 2021-11-20 ENCOUNTER — Encounter: Payer: Self-pay | Admitting: Family Medicine

## 2021-12-08 ENCOUNTER — Ambulatory Visit (INDEPENDENT_AMBULATORY_CARE_PROVIDER_SITE_OTHER): Payer: BC Managed Care – PPO | Admitting: Physician Assistant

## 2021-12-08 ENCOUNTER — Encounter: Payer: Self-pay | Admitting: Physician Assistant

## 2021-12-08 VITALS — BP 135/85 | HR 78 | Ht 76.0 in | Wt 220.0 lb

## 2021-12-08 DIAGNOSIS — K219 Gastro-esophageal reflux disease without esophagitis: Secondary | ICD-10-CM

## 2021-12-08 DIAGNOSIS — Z1211 Encounter for screening for malignant neoplasm of colon: Secondary | ICD-10-CM

## 2021-12-08 DIAGNOSIS — Z23 Encounter for immunization: Secondary | ICD-10-CM

## 2021-12-08 DIAGNOSIS — Z113 Encounter for screening for infections with a predominantly sexual mode of transmission: Secondary | ICD-10-CM | POA: Diagnosis not present

## 2021-12-08 HISTORY — DX: Gastro-esophageal reflux disease without esophagitis: K21.9

## 2021-12-08 NOTE — Progress Notes (Signed)
? ?  Subjective:  ? ? Patient ID: Adrian Anderson, male    DOB: Jan 01, 1976, 46 y.o.   MRN: [id number] ? ?HPI ?Pt is a 46 yo male who presents to the clinic for STI routine testing. He has not been tested for any STI in over a year and had a new partner for about a year. He denies any abnormal or worrisome symptoms such as dysuria, abdominal pain, penile discharge, warts or lesions.  ? ?Pt does take omeprazole as needed for epigastric pain and reflux symptoms. No significant problems or concerns. Denies any melena or hematochezia. Hx of hemorrhoids but no recent problems with them.  ? ?.. ?Active Ambulatory Problems  ?  Diagnosis Date Noted  ? Chest pain 06/10/2016  ? Fatigue 06/10/2016  ? Nevus 06/10/2016  ? Shoulder separation, right, initial encounter 02/12/2017  ? Epigastric pain 11/08/2019  ? Libido, decreased 06/24/2020  ? Well adult exam 09/03/2020  ? Chronic rhinitis 09/03/2020  ? Gastroesophageal reflux disease without esophagitis 12/08/2021  ? ?Resolved Ambulatory Problems  ?  Diagnosis Date Noted  ? No Resolved Ambulatory Problems  ? ?No Additional Past Medical History  ? ? ? ?Review of Systems  ?All other systems reviewed and are negative. ? ?   ?Objective:  ? Physical Exam ?Vitals reviewed.  ?Constitutional:   ?   Appearance: Normal appearance.  ?HENT:  ?   Head: Normocephalic.  ?Cardiovascular:  ?   Rate and Rhythm: Normal rate and regular rhythm.  ?Pulmonary:  ?   Effort: Pulmonary effort is normal.  ?Neurological:  ?   General: No focal deficit present.  ?   Mental Status: He is alert and oriented to person, place, and time.  ?Psychiatric:     ?   Mood and Affect: Mood normal.  ? ? ? ? ? ?   ?Assessment & Plan:  ?..Adrian Anderson was seen today for exposure to std. ? ?Diagnoses and all orders for this visit: ? ?Routine screening for STI (sexually transmitted infection) ?-     C. trachomatis/N. gonorrhoeae RNA ?-     HIV antibody (with reflex) ?-     RPR ?-     HSV 2 antibody, IgG ?-     HSV 1 antibody, IgG ?-      COMPLETE METABOLIC PANEL WITH GFR ?-     Hepatitis C Antibody ? ?Colon cancer screening ?-     Ambulatory referral to Gastroenterology ? ?Gastroesophageal reflux disease without esophagitis ? ? ?STI testing ordered via urine and labs today ?Will treat accordingly ? ?Pt is 81 and needs colonoscopy ?Pt agreed for referral ? ?Discussed GERD diet and using omeprazole as needed ?HO given ? ? ?

## 2021-12-08 NOTE — Patient Instructions (Signed)
Food Choices for Gastroesophageal Reflux Disease, Adult When you have gastroesophageal reflux disease (GERD), the foods you eat and your eating habits are very important. Choosing the right foods can help ease the discomfort of GERD. Consider working with a dietitian to help you make healthy food choices. What are tips for following this plan? Reading food labels Look for foods that are low in saturated fat. Foods that have less than 5% of daily value (DV) of fat and 0 g of trans fats may help with your symptoms. Cooking Cook foods using methods other than frying. This may include baking, steaming, grilling, or broiling. These are all methods that do not need a lot of fat for cooking. To add flavor, try to use herbs that are low in spice and acidity. Meal planning  Choose healthy foods that are low in fat, such as fruits, vegetables, whole grains, low-fat dairy products, lean meats, fish, and poultry. Eat frequent, small meals instead of three large meals each day. Eat your meals slowly, in a relaxed setting. Avoid bending over or lying down until 2-3 hours after eating. Limit high-fat foods such as fatty meats or fried foods. Limit your intake of fatty foods, such as oils, butter, and shortening. Avoid the following as told by your health care provider: Foods that cause symptoms. These may be different for different people. Keep a food diary to keep track of foods that cause symptoms. Alcohol. Drinking large amounts of liquid with meals. Eating meals during the 2-3 hours before bed. Lifestyle Maintain a healthy weight. Ask your health care provider what weight is healthy for you. If you need to lose weight, work with your health care provider to do so safely. Exercise for at least 30 minutes on 5 or more days each week, or as told by your health care provider. Avoid wearing clothes that fit tightly around your waist and chest. Do not use any products that contain nicotine or tobacco. These  products include cigarettes, chewing tobacco, and vaping devices, such as e-cigarettes. If you need help quitting, ask your health care provider. Sleep with the head of your bed raised. Use a wedge under the mattress or blocks under the bed frame to raise the head of the bed. Chew sugar-free gum after mealtimes. What foods should I eat?  Eat a healthy, well-balanced diet of fruits, vegetables, whole grains, low-fat dairy products, lean meats, fish, and poultry. Each person is different. Foods that may trigger symptoms in one person may not trigger any symptoms in another person. Work with your health care provider to identify foods that are safe for you. The items listed above may not be a complete list of recommended foods and beverages. Contact a dietitian for more information. What foods should I avoid? Limiting some of these foods may help manage the symptoms of GERD. Everyone is different. Consult a dietitian or your health care provider to help you identify the exact foods to avoid, if any. Fruits Any fruits prepared with added fat. Any fruits that cause symptoms. For some people this may include citrus fruits, such as oranges, grapefruit, pineapple, and lemons. Vegetables Deep-fried vegetables. French fries. Any vegetables prepared with added fat. Any vegetables that cause symptoms. For some people, this may include tomatoes and tomato products, chili peppers, onions and garlic, and horseradish. Grains Pastries or quick breads with added fat. Meats and other proteins High-fat meats, such as fatty beef or pork, hot dogs, ribs, ham, sausage, salami, and bacon. Fried meat or protein, including   fried fish and fried chicken. Nuts and nut butters, in large amounts. Dairy Whole milk and chocolate milk. Sour cream. Cream. Ice cream. Cream cheese. Milkshakes. Fats and oils Butter. Margarine. Shortening. Ghee. Beverages Coffee and tea, with or without caffeine. Carbonated beverages. Sodas. Energy  drinks. Fruit juice made with acidic fruits, such as orange or grapefruit. Tomato juice. Alcoholic drinks. Sweets and desserts Chocolate and cocoa. Donuts. Seasonings and condiments Pepper. Peppermint and spearmint. Added salt. Any condiments, herbs, or seasonings that cause symptoms. For some people, this may include curry, hot sauce, or vinegar-based salad dressings. The items listed above may not be a complete list of foods and beverages to avoid. Contact a dietitian for more information. Questions to ask your health care provider Diet and lifestyle changes are usually the first steps that are taken to manage symptoms of GERD. If diet and lifestyle changes do not improve your symptoms, talk with your health care provider about taking medicines. Where to find more information International Foundation for Gastrointestinal Disorders: aboutgerd.org Summary When you have gastroesophageal reflux disease (GERD), food and lifestyle choices may be very helpful in easing the discomfort of GERD. Eat frequent, small meals instead of three large meals each day. Eat your meals slowly, in a relaxed setting. Avoid bending over or lying down until 2-3 hours after eating. Limit high-fat foods such as fatty meats or fried foods. This information is not intended to replace advice given to you by your health care provider. Make sure you discuss any questions you have with your health care provider. Document Revised: 02/19/2020 Document Reviewed: 02/19/2020 Elsevier Patient Education  2023 Elsevier Inc.  

## 2021-12-09 ENCOUNTER — Encounter: Payer: Self-pay | Admitting: Physician Assistant

## 2021-12-09 LAB — COMPLETE METABOLIC PANEL WITH GFR
AG Ratio: 2 (calc) (ref 1.0–2.5)
ALT: 17 U/L (ref 9–46)
AST: 20 U/L (ref 10–40)
Albumin: 4.8 g/dL (ref 3.6–5.1)
Alkaline phosphatase (APISO): 69 U/L (ref 36–130)
BUN/Creatinine Ratio: 11 (calc) (ref 6–22)
BUN: 15 mg/dL (ref 7–25)
CO2: 28 mmol/L (ref 20–32)
Calcium: 9.6 mg/dL (ref 8.6–10.3)
Chloride: 102 mmol/L (ref 98–110)
Creat: 1.42 mg/dL — ABNORMAL HIGH (ref 0.60–1.29)
Globulin: 2.4 g/dL (calc) (ref 1.9–3.7)
Glucose, Bld: 113 mg/dL — ABNORMAL HIGH (ref 65–99)
Potassium: 4 mmol/L (ref 3.5–5.3)
Sodium: 140 mmol/L (ref 135–146)
Total Bilirubin: 0.7 mg/dL (ref 0.2–1.2)
Total Protein: 7.2 g/dL (ref 6.1–8.1)
eGFR: 62 mL/min/{1.73_m2} (ref >=60)

## 2021-12-09 LAB — HSV 2 ANTIBODY, IGG: HSV 2 Glycoprotein G Ab, IgG: <0.9 index

## 2021-12-09 LAB — HEPATITIS C ANTIBODY
Hepatitis C Ab: NONREACTIVE
SIGNAL TO CUT-OFF: 0.14 (ref <1.00)

## 2021-12-09 LAB — C. TRACHOMATIS/N. GONORRHOEAE RNA
C. trachomatis RNA, TMA: NOT DETECTED
N. gonorrhoeae RNA, TMA: NOT DETECTED

## 2021-12-09 LAB — HIV ANTIBODY (ROUTINE TESTING W REFLEX): HIV 1&2 Ab, 4th Generation: NONREACTIVE

## 2021-12-09 LAB — RPR: RPR Ser Ql: NONREACTIVE

## 2021-12-09 LAB — HSV 1 ANTIBODY, IGG: HSV 1 Glycoprotein G Ab, IgG: 2.15 index — ABNORMAL HIGH

## 2021-12-09 NOTE — Progress Notes (Signed)
HIV negative ?Creatinine is elevated from last recheck and GFR has decreased some but still above 60.  ?Recheck with PCP to make sure not continuing to decline.  ?Make sure to stay hydrated and avoid excessive NSAID use.  ? ?More labs will continue to come in.

## 2021-12-10 ENCOUNTER — Encounter: Payer: Self-pay | Admitting: Physician Assistant

## 2021-12-10 NOTE — Progress Notes (Signed)
No gonorrhea or Chlamydia.  ?No syphilis. ?No antibodies to HSV2.  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

## 2022-02-16 ENCOUNTER — Ambulatory Visit (AMBULATORY_SURGERY_CENTER): Payer: BC Managed Care – PPO

## 2022-02-16 VITALS — Ht 76.0 in | Wt 215.0 lb

## 2022-02-16 DIAGNOSIS — Z1211 Encounter for screening for malignant neoplasm of colon: Secondary | ICD-10-CM

## 2022-02-16 MED ORDER — NA SULFATE-K SULFATE-MG SULF 17.5-3.13-1.6 GM/177ML PO SOLN: 1.0000 | Freq: Once | ORAL | 0 refills | Status: AC

## 2022-03-03 ENCOUNTER — Encounter: Payer: Self-pay | Admitting: Internal Medicine

## 2022-03-06 ENCOUNTER — Encounter: Payer: Self-pay | Admitting: Internal Medicine

## 2022-03-06 ENCOUNTER — Ambulatory Visit (AMBULATORY_SURGERY_CENTER): Payer: BC Managed Care – PPO | Admitting: Internal Medicine

## 2022-03-06 VITALS — BP 128/76 | HR 57 | Temp 97.5°F | Resp 13 | Ht 76.0 in | Wt 215.0 lb

## 2022-03-06 DIAGNOSIS — K635 Polyp of colon: Secondary | ICD-10-CM

## 2022-03-06 DIAGNOSIS — Z1211 Encounter for screening for malignant neoplasm of colon: Secondary | ICD-10-CM

## 2022-03-06 DIAGNOSIS — D122 Benign neoplasm of ascending colon: Secondary | ICD-10-CM

## 2022-03-06 DIAGNOSIS — D120 Benign neoplasm of cecum: Secondary | ICD-10-CM

## 2022-03-06 MED ORDER — SODIUM CHLORIDE 0.9 % IV SOLN: 500.0000 mL | Freq: Once | INTRAVENOUS | Status: DC

## 2022-03-06 MED ORDER — HYDROCORTISONE (PERIANAL) 2.5 % EX CREA: 1.0000 | TOPICAL_CREAM | Freq: Two times a day (BID) | CUTANEOUS | 1 refills | Status: AC

## 2022-03-06 NOTE — Op Note (Signed)
[place] Patient Name: Adrian Anderson Procedure Date: 03/06/2022 9:14 AM MRN: [id number] Endoscopist: Sonny Masters "Adrian Anderson ,  Age: 46 Referring MD:  Date of Birth: 15-Oct-1975 Gender: Male Account #: [id number] Procedure:                Colonoscopy Indications:              Screening for colorectal malignant neoplasm, This                            is the patient's first colonoscopy Medicines:                Monitored Anesthesia Care Procedure:                Pre-Anesthesia Assessment:                           - Prior to the procedure, a History and Physical                            was performed, and patient medications and                            allergies were reviewed. The patient's tolerance of                            previous anesthesia was also reviewed. The risks                            and benefits of the procedure and the sedation                            options and risks were discussed with the patient.                            All questions were answered, and informed consent                            was obtained. Prior Anticoagulants: The patient has                            taken no previous anticoagulant or antiplatelet                            agents. ASA Grade Assessment: II - A patient with                            mild systemic disease. After reviewing the risks                            and benefits, the patient was deemed in                            satisfactory condition to undergo the procedure.  After obtaining informed consent, the colonoscope                            was passed under direct vision. Throughout the                            procedure, the patient's blood pressure, pulse, and                            oxygen saturations were monitored continuously. The                            CF [id number] #[id number] was introduced through the anus                            and advanced to the  the terminal ileum. The                            colonoscopy was performed without difficulty. The                            patient tolerated the procedure well. The quality                            of the bowel preparation was good. The terminal                            ileum, ileocecal valve, appendiceal orifice, and                            rectum were photographed. Scope In: 9:29:49 AM Scope Out: 10:04:52 AM Scope Withdrawal Time: 0 hours 18 minutes 47 seconds  Total Procedure Duration: 0 hours 35 minutes 3 seconds  Findings:                 The terminal ileum appeared normal.                           Two sessile polyps were found in the ascending                            colon and cecum. The polyps were 1 to 2 mm in size.                            These polyps were removed with a cold biopsy                            forceps. Resection and retrieval were complete.                           A 3 mm polyp was found in the ascending colon. The                            polyp was  sessile. The polyp was removed with a                            cold snare. Resection and retrieval were complete.                           Non-bleeding internal hemorrhoids were found during                            retroflexion. Complications:            No immediate complications. Estimated Blood Loss:     Estimated blood loss was minimal. Impression:               - The examined portion of the ileum was normal.                           - Two 1 to 2 mm polyps in the ascending colon and                            in the cecum, removed with a cold biopsy forceps.                            Resected and retrieved.                           - One 3 mm polyp in the ascending colon, removed                            with a cold snare. Resected and retrieved.                           - Non-bleeding internal hemorrhoids. Recommendation:           - Discharge patient to home (with escort).                            - Await pathology results.                           - Anusol HC cream BID for 7 days.                           - The findings and recommendations were discussed                            with the patient. Sonny Masters "Adrian Anderson,  03/06/2022 10:09:33 AM

## 2022-03-06 NOTE — Patient Instructions (Signed)
Handouts on polyps given to patient.  Await pathology results. Resume previous diet and continue present medications. Repeat colonoscopy for surveillance will be determined based off of pathology results. Use Anusol Hydrocortisone Cream twice a day for 7 days - Pick up prescription from [facility] off of [address]:   Refer to the procedure report that was given to you for any specific questions about what was found during the examination.  If the procedure report does not answer your questions, please call your gastroenterologist to clarify.  If you requested that your care partner not be given the details of your procedure findings, then the procedure report has been included in a sealed envelope for you to review at your convenience later.  YOU SHOULD EXPECT: Some feelings of bloating in the abdomen. Passage of more gas than usual.  Walking can help get rid of the air that was put into your GI tract during the procedure and reduce the bloating. If you had a lower endoscopy (such as a colonoscopy or flexible sigmoidoscopy) you may notice spotting of blood in your stool or on the toilet paper. If you underwent a bowel prep for your procedure, you may not have a normal bowel movement for a few days.  Please Note:  You might notice some irritation and congestion in your nose or some drainage.  This is from the oxygen used during your procedure.  There is no need for concern and it should clear up in a day or so.  SYMPTOMS TO REPORT IMMEDIATELY:  Following lower endoscopy (colonoscopy or flexible sigmoidoscopy):  Excessive amounts of blood in the stool  Significant tenderness or worsening of abdominal pains  Swelling of the abdomen that is new, acute  Fever of 100F or higher  For urgent or emergent issues, a gastroenterologist can be reached at any hour by calling [phone]. Do not use MyChart messaging for  urgent concerns.    DIET:  We do recommend a small meal at first, but then you may proceed to your regular diet.  Drink plenty of fluids but you should avoid alcoholic beverages for 24 hours.  ACTIVITY:  You should plan to take it easy for the rest of today and you should NOT DRIVE or use heavy machinery until tomorrow (because of the sedation medicines used during the test).    FOLLOW UP: Our staff will call the number listed on your records the next business day following your procedure.  We will call around 7:15- 8:00 am to check on you and address any questions or concerns that you may have regarding the information given to you following your procedure. If we do not reach you, we will leave a message.  If you develop any symptoms (ie: fever, flu-like symptoms, shortness of breath, cough etc.) before then, please call [phone].  If you test positive for Covid 19 in the 2 weeks post procedure, please call and report this information to [place].    If any biopsies were taken you will be contacted by phone or by letter within the next 1-3 weeks.  Please call us at [phone] if you have not heard about the biopsies in 3 weeks.    SIGNATURES/CONFIDENTIALITY: You and/or your care partner have signed paperwork which will be entered into your electronic medical record.  These signatures attest to the fact that that the information above on your After Visit Summary has been reviewed  and is understood.  Full responsibility of the confidentiality of this discharge information lies with you and/or your care-partner.

## 2022-03-06 NOTE — Progress Notes (Signed)
Called to room to assist during endoscopic procedure.  Patient ID and intended procedure confirmed with present staff. Received instructions for my participation in the procedure from the performing physician.  

## 2022-03-06 NOTE — Progress Notes (Signed)
Pt's states no medical or surgical changes since previsit or office visit. 

## 2022-03-06 NOTE — Progress Notes (Signed)
A and O x3. Report to RN. Tolerated MAC anesthesia well. 

## 2022-03-06 NOTE — Progress Notes (Signed)
GASTROENTEROLOGY PROCEDURE H&P NOTE   Primary Care Physician: Luetta Nutting, DO    Reason for Procedure:   Colon cancer screening  Plan:    Colonoscopy  Patient is appropriate for endoscopic procedure(s) in the ambulatory ([place]) setting.  The nature of the procedure, as well as the risks, benefits, and alternatives were carefully and thoroughly reviewed with the patient. Ample time for discussion and questions allowed. The patient understood, was satisfied, and agreed to proceed.     HPI: Adrian Anderson is a 46 y.o. male who presents for colonoscopy for colon cancer screening. Denies blood in the stools, changes in bowel habits, weight loss. Denies family history of colon cancer.  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 Authorizing Provider  acetaminophen (TYLENOL) 325 MG tablet Take 650 mg by mouth every 6 (six) hours as needed.    [provider]  ibuprofen (ADVIL) 400 MG tablet Take 400 mg by mouth every 6 (six) hours as needed.    [provider]  omeprazole (PRILOSEC) 10 MG capsule Take 1 capsule by mouth daily as needed.    [provider]  tadalafil (CIALIS) 5 MG tablet TAKE ONE TO TWO TABLETS BY MOUTH EVERY OTHER DAY AS NEEDED 09/08/21   Luetta Nutting, DO    Current Outpatient Medications  Medication Sig Dispense Refill   acetaminophen (TYLENOL) 325 MG tablet Take 650 mg by mouth every 6 (six) hours as needed.     ibuprofen (ADVIL) 400 MG tablet Take 400 mg by mouth every 6 (six) hours as needed.     omeprazole (PRILOSEC) 10 MG capsule Take 1 capsule by mouth daily as needed.     tadalafil (CIALIS) 5 MG tablet TAKE ONE TO TWO TABLETS BY MOUTH EVERY OTHER DAY AS NEEDED 30 tablet 6    Current Facility-Administered Medications  Medication Dose Route Frequency Provider Last Rate Last Admin   0.9 %  sodium chloride infusion  500 mL Intravenous Once Sharyn Creamer, MD        Allergies as of 03/06/2022 - Review Complete 03/06/2022  Allergen Reaction Noted   Dairy aid [tilactase] Nausea And Vomiting 05/08/2016    Family History  Problem Relation Age of Onset   Hypertension Mother    Stomach cancer Father    Cancer Father    Colon cancer Neg Hx    Esophageal cancer Neg Hx    Rectal cancer Neg Hx     Social History   Socioeconomic History   Marital status: Married    Spouse name: Not on file   Number of children: Not on file   Years of education: Not on file   Highest education level: Not on file  Occupational History   Not on file  Tobacco Use   Smoking status: Some Days   Smokeless tobacco: Never   Tobacco comments:    A few times per year  Vaping Use   Vaping Use: Never used  Substance and Sexual Activity   Alcohol use: Yes    Alcohol/week: 2.0 standard drinks of alcohol    Types: 2 Cans of beer per week    Comment: occasionally wine or liquor   Drug use: Never   Sexual  activity: Yes    Partners: Female  Other Topics Concern   Not on file  Social History Narrative   Not on file   Social Determinants of Health   Financial Resource Strain: Not on file  Food Insecurity: Not on file  Transportation Needs: Not on file  Physical Activity: Not on file  Stress: Not on file  Social Connections: Not on file  Intimate Partner Violence: Not on file    Physical Exam: Vital signs in last 24 hours: BP (!) 138/93   Pulse 82   Temp (!) 97.5 F (36.4 C)   Ht '6\' 4"'$  (1.93 m)   Wt 215 lb (97.5 kg)   SpO2 95%   BMI 26.17 kg/m  GEN: NAD EYE: Sclerae anicteric ENT: MMM CV: Non-tachycardic Pulm: No increased work of breathing GI: Soft, NT/ND NEURO:  Alert & Oriented   [name], MD [facility]  03/06/2022 8:53 AM'

## 2022-03-09 ENCOUNTER — Telehealth: Payer: Self-pay

## 2022-03-09 NOTE — Telephone Encounter (Signed)
  Follow up Call-     03/06/2022    8:43 AM  Call back number  Post procedure Call Back phone  # [phone]  Permission to leave phone message Yes     Patient questions:  Do you have a fever, pain , or abdominal swelling? No. Pain Score  0 *  Have you tolerated food without any problems? Yes.    Have you been able to return to your normal activities? Yes.    Do you have any questions about your discharge instructions: Diet   No. Medications  No. Follow up visit  No.  Do you have questions or concerns about your Care? No.  Actions: * If pain score is 4 or above: No action needed, pain <4.

## 2022-03-10 ENCOUNTER — Encounter: Payer: Self-pay | Admitting: Internal Medicine

## 2023-07-16 DIAGNOSIS — M5386 Other specified dorsopathies, lumbar region: Secondary | ICD-10-CM | POA: Diagnosis not present

## 2023-07-16 DIAGNOSIS — M9904 Segmental and somatic dysfunction of sacral region: Secondary | ICD-10-CM | POA: Diagnosis not present

## 2023-07-16 DIAGNOSIS — M9902 Segmental and somatic dysfunction of thoracic region: Secondary | ICD-10-CM | POA: Diagnosis not present

## 2023-07-16 DIAGNOSIS — M9903 Segmental and somatic dysfunction of lumbar region: Secondary | ICD-10-CM | POA: Diagnosis not present

## 2023-07-16 DIAGNOSIS — M9901 Segmental and somatic dysfunction of cervical region: Secondary | ICD-10-CM | POA: Diagnosis not present

## 2023-08-23 DIAGNOSIS — K047 Periapical abscess without sinus: Secondary | ICD-10-CM | POA: Diagnosis not present

## 2023-08-23 DIAGNOSIS — R220 Localized swelling, mass and lump, head: Secondary | ICD-10-CM | POA: Diagnosis not present

## 2023-11-25 ENCOUNTER — Ambulatory Visit

## 2023-11-25 ENCOUNTER — Ambulatory Visit (INDEPENDENT_AMBULATORY_CARE_PROVIDER_SITE_OTHER): Admitting: Family Medicine

## 2023-11-25 ENCOUNTER — Encounter: Payer: Self-pay | Admitting: Family Medicine

## 2023-11-25 VITALS — BP 144/89 | HR 64 | Ht 76.0 in | Wt 218.0 lb

## 2023-11-25 DIAGNOSIS — R6882 Decreased libido: Secondary | ICD-10-CM | POA: Diagnosis not present

## 2023-11-25 DIAGNOSIS — M25561 Pain in right knee: Secondary | ICD-10-CM

## 2023-11-25 DIAGNOSIS — Z0000 Encounter for general adult medical examination without abnormal findings: Secondary | ICD-10-CM

## 2023-11-25 DIAGNOSIS — M25461 Effusion, right knee: Secondary | ICD-10-CM

## 2023-11-25 DIAGNOSIS — Z13220 Encounter for screening for lipoid disorders: Secondary | ICD-10-CM

## 2023-11-25 NOTE — Assessment & Plan Note (Signed)
 Well adult Orders Placed This Encounter  Procedures   DG Knee Complete 4 Views Right    Standing Status:   Future    Number of Occurrences:   1    Expiration Date:   11/24/2024    Reason for Exam (SYMPTOM  OR DIAGNOSIS REQUIRED):   Right knee pain    Preferred imaging location?:   [facility]   CBC with Differential/Platelet   CMP14+EGFR   Lipid Panel With LDL/HDL Ratio   Testosterone   HgB A1c   TSH    Immunizations: UTD Screenings: UTD Anticipatory guidance/Risk factor reduction: Recommendations per AVS

## 2023-11-25 NOTE — Patient Instructions (Signed)

## 2023-11-25 NOTE — Progress Notes (Signed)
 Adrian Anderson - 48 y.o. male MRN [id number]  Date of birth: Mar 02, 1976  Subjective Chief Complaint  Patient presents with   Knee Pain   Fatigue    HPI Adrian Anderson is a 48 y.o. male here today for annual exam.   He has concerns of R knee pain.  He does not really have pain with ambulation.  This started after kneeling and had pain over the patella.  He has point tenderness over the patella.  Denies swelling or bruising.  He has had some fatigue and would like testosterone levels checked as well as other routine labs.    He does stay fairly active.  He feels like diet is pretty good most of the time.  Rare tobacco use.  Occasional alcohol use.  Review of Systems  Constitutional:  Negative for chills, fever, malaise/fatigue and weight loss.  HENT:  Negative for congestion, ear pain and sore throat.   Eyes:  Negative for blurred vision, double vision and pain.  Respiratory:  Negative for cough and shortness of breath.   Cardiovascular:  Negative for chest pain and palpitations.  Gastrointestinal:  Negative for abdominal pain, blood in stool, constipation, heartburn and nausea.  Genitourinary:  Negative for dysuria and urgency.  Musculoskeletal:  Negative for joint pain and myalgias.  Neurological:  Negative for dizziness and headaches.  Endo/Heme/Allergies:  Does not bruise/bleed easily.  Psychiatric/Behavioral:  Negative for depression. The patient is not nervous/anxious and does not have insomnia.     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   RHINOPLASTY  06/2021    Social History   Socioeconomic History   Marital status: Married    Spouse name: Not on file   Number of children: Not on file   Years of education: Not on file   Highest  education level: Master's degree (e.g., MA, MS, MEng, MEd, MSW, MBA)  Occupational History   Not on file  Tobacco Use   Smoking status: Some Days   Smokeless tobacco: Never   Tobacco comments:    A few times per year  Vaping Use   Vaping status: Never Used  Substance and Sexual Activity   Alcohol use: Yes    Alcohol/week: 2.0 standard drinks of alcohol    Types: 2 Cans of beer per week    Comment: occasionally wine or liquor   Drug use: Never   Sexual activity: Yes    Partners: Female  Other Topics Concern   Not on file  Social History Narrative   Not on file   Social Drivers of Health   Financial Resource Strain: Low Risk  (11/23/2023)   Overall Financial Resource Strain (CARDIA)    Difficulty of Paying Living Expenses: Not hard at all  Food Insecurity: No Food Insecurity (11/23/2023)   Hunger Vital Sign    Worried About Running Out of Food in the Last Year: Never true    Ran Out of Food in the Last Year: Never true  Transportation Needs: No Transportation Needs (11/23/2023)   PRAPARE - Administrator, Civil Service (Medical): No    Lack of Transportation (Non-Medical): No  Physical Activity: Sufficiently Active (11/23/2023)   Exercise Vital Sign    Days of Exercise per Week: 2  days    Minutes of Exercise per Session: 90 min  Stress: Stress Concern Present (11/23/2023)   [employer] of Occupational Health - Occupational Stress Questionnaire    Feeling of Stress : Rather much  Social Connections: Moderately Isolated (11/23/2023)   Social Connection and Isolation Panel [NHANES]    Frequency of Communication with Friends and Family: More than three times a week    Frequency of Social Gatherings with Friends and Family: Once a week    Attends Religious Services: Never    Database administrator or Organizations: No    Attends Engineer, structural: Not on file    Marital Status: Married    Family History  Problem Relation Age of Onset   Hypertension  Mother    Stomach cancer Father    Cancer Father    Colon cancer Neg Hx    Esophageal cancer Neg Hx    Rectal cancer Neg Hx     Health Maintenance  Topic Date Due   Pneumococcal Vaccine 60-52 Years old (1 of 2 - PCV) Never done   COVID-19 Vaccine (3 - 2024-25 season) 04/25/2023   DTaP/Tdap/Td (3 - Td or Tdap) 12/09/2031   Colonoscopy  03/06/2032   Hepatitis C Screening  Completed   HIV Screening  Completed   HPV VACCINES  Aged Out   INFLUENZA VACCINE  Discontinued     ----------------------------------------------------------------------------------------------------------------------------------------------------------------------------------------------------------------- Physical Exam BP (!) 144/89   Pulse 64   Ht 6\' 4"  (1.93 m)   Wt 218 lb (98.9 kg)   SpO2 99%   BMI 26.54 kg/m   Physical Exam Constitutional:      General: He is not in acute distress. HENT:     Head: Normocephalic and atraumatic.     Right Ear: Tympanic membrane and external ear normal.     Left Ear: Tympanic membrane and external ear normal.  Eyes:     General: No scleral icterus. Neck:     Thyroid: No thyromegaly.  Cardiovascular:     Rate and Rhythm: Normal rate and regular rhythm.     Heart sounds: Normal heart sounds.  Pulmonary:     Effort: Pulmonary effort is normal.     Breath sounds: Normal breath sounds.  Abdominal:     General: Bowel sounds are normal. There is no distension.     Palpations: Abdomen is soft.     Tenderness: There is no abdominal tenderness. There is no guarding.  Musculoskeletal:     Cervical back: Normal range of motion.  Lymphadenopathy:     Cervical: No cervical adenopathy.  Skin:    General: Skin is warm and dry.     Findings: No rash.  Neurological:     Mental Status: He is alert and oriented to person, place, and time.     Cranial Nerves: No cranial nerve deficit.     Motor: No abnormal muscle tone.  Psychiatric:        Mood and Affect: Mood normal.         Behavior: Behavior normal.     ------------------------------------------------------------------------------------------------------------------------------------------------------------------------------------------------------------------- Assessment and Plan  Well adult exam Well adult Orders Placed This Encounter  Procedures   DG Knee Complete 4 Views Right    Standing Status:   Future    Number of Occurrences:   1    Expiration Date:   11/24/2024    Reason for Exam (SYMPTOM  OR DIAGNOSIS REQUIRED):   Right knee pain    Preferred imaging location?:  [facility]   CBC with Differential/Platelet   CMP14+EGFR   Lipid Panel With LDL/HDL Ratio   Testosterone   HgB A1c   TSH    Immunizations: UTD Screenings: UTD Anticipatory guidance/Risk factor reduction: Recommendations per AVS   No orders of the defined types were placed in this encounter.   No follow-ups on file.

## 2023-11-26 ENCOUNTER — Encounter: Payer: Self-pay | Admitting: Family Medicine

## 2023-11-26 ENCOUNTER — Encounter: Admitting: Physician Assistant

## 2023-11-26 LAB — LIPID PANEL WITH LDL/HDL RATIO
Cholesterol, Total: 297 mg/dL — ABNORMAL HIGH (ref 100–199)
HDL: 48 mg/dL (ref >39)
LDL Chol Calc (NIH): 211 mg/dL — ABNORMAL HIGH (ref 0–99)
LDL/HDL Ratio: 4.4 ratio — ABNORMAL HIGH (ref 0.0–3.6)
Triglycerides: 197 mg/dL — ABNORMAL HIGH (ref 0–149)
VLDL Cholesterol Cal: 38 mg/dL (ref 5–40)

## 2023-11-26 LAB — CBC WITH DIFFERENTIAL/PLATELET
Basophils Absolute: 0.1 10*3/uL (ref 0.0–0.2)
Basos: 1 %
EOS (ABSOLUTE): 0.1 10*3/uL (ref 0.0–0.4)
Eos: 3 %
Hematocrit: 46.3 % (ref 37.5–51.0)
Hemoglobin: 15.6 g/dL (ref 13.0–17.7)
Immature Grans (Abs): 0 10*3/uL (ref 0.0–0.1)
Immature Granulocytes: 0 %
Lymphocytes Absolute: 1.6 10*3/uL (ref 0.7–3.1)
Lymphs: 35 %
MCH: 31.1 pg (ref 26.6–33.0)
MCHC: 33.7 g/dL (ref 31.5–35.7)
MCV: 92 fL (ref 79–97)
Monocytes Absolute: 0.4 10*3/uL (ref 0.1–0.9)
Monocytes: 9 %
Neutrophils Absolute: 2.5 10*3/uL (ref 1.4–7.0)
Neutrophils: 52 %
Platelets: 172 10*3/uL (ref 150–450)
RBC: 5.01 x10E6/uL (ref 4.14–5.80)
RDW: 12.4 % (ref 11.6–15.4)
WBC: 4.7 10*3/uL (ref 3.4–10.8)

## 2023-11-26 LAB — CMP14+EGFR
ALT: 22 IU/L (ref 0–44)
AST: 25 IU/L (ref 0–40)
Albumin: 4.9 g/dL (ref 4.1–5.1)
Alkaline Phosphatase: 73 IU/L (ref 44–121)
BUN/Creatinine Ratio: 10 (ref 9–20)
BUN: 13 mg/dL (ref 6–24)
Bilirubin Total: 0.8 mg/dL (ref 0.0–1.2)
CO2: 24 mmol/L (ref 20–29)
Calcium: 9.9 mg/dL (ref 8.7–10.2)
Chloride: 99 mmol/L (ref 96–106)
Creatinine, Ser: 1.25 mg/dL (ref 0.76–1.27)
Globulin, Total: 2.4 g/dL (ref 1.5–4.5)
Glucose: 86 mg/dL (ref 70–99)
Potassium: 4.2 mmol/L (ref 3.5–5.2)
Sodium: 139 mmol/L (ref 134–144)
Total Protein: 7.3 g/dL (ref 6.0–8.5)
eGFR: 71 mL/min/{1.73_m2} (ref >59)

## 2023-11-26 LAB — HEMOGLOBIN A1C
Est. average glucose Bld gHb Est-mCnc: 114 mg/dL
Hgb A1c MFr Bld: 5.6 % (ref 4.8–5.6)

## 2023-11-26 LAB — TSH: TSH: 1.46 u[IU]/mL (ref 0.450–4.500)

## 2023-11-26 LAB — TESTOSTERONE: Testosterone: 762 ng/dL (ref 264–916)

## 2023-11-29 ENCOUNTER — Encounter: Payer: Self-pay | Admitting: Family Medicine

## 2023-12-08 ENCOUNTER — Emergency Department (HOSPITAL_BASED_OUTPATIENT_CLINIC_OR_DEPARTMENT_OTHER)
Admission: EM | Admit: 2023-12-08 | Discharge: 2023-12-08 | Disposition: A | Discharge status: Home / Self Care | Attending: Emergency Medicine | Admitting: Emergency Medicine

## 2023-12-08 ENCOUNTER — Encounter (HOSPITAL_BASED_OUTPATIENT_CLINIC_OR_DEPARTMENT_OTHER): Payer: Self-pay | Admitting: Emergency Medicine

## 2023-12-08 ENCOUNTER — Other Ambulatory Visit: Payer: Self-pay

## 2023-12-08 ENCOUNTER — Emergency Department (HOSPITAL_BASED_OUTPATIENT_CLINIC_OR_DEPARTMENT_OTHER)

## 2023-12-08 DIAGNOSIS — M25512 Pain in left shoulder: Secondary | ICD-10-CM | POA: Insufficient documentation

## 2023-12-08 DIAGNOSIS — R0789 Other chest pain: Secondary | ICD-10-CM | POA: Diagnosis not present

## 2023-12-08 DIAGNOSIS — R079 Chest pain, unspecified: Secondary | ICD-10-CM

## 2023-12-08 LAB — BASIC METABOLIC PANEL WITH GFR
Anion gap: 11 (ref 5–15)
BUN: 22 mg/dL — ABNORMAL HIGH (ref 6–20)
CO2: 24 mmol/L (ref 22–32)
Calcium: 9.4 mg/dL (ref 8.9–10.3)
Chloride: 99 mmol/L (ref 98–111)
Creatinine, Ser: 1.17 mg/dL (ref 0.61–1.24)
GFR, Estimated: >60 mL/min (ref >60)
Glucose, Bld: 93 mg/dL (ref 70–99)
Potassium: 4 mmol/L (ref 3.5–5.1)
Sodium: 134 mmol/L — ABNORMAL LOW (ref 135–145)

## 2023-12-08 LAB — CBC
HCT: 42.2 % (ref 39.0–52.0)
Hemoglobin: 14.9 g/dL (ref 13.0–17.0)
MCH: 31 pg (ref 26.0–34.0)
MCHC: 35.3 g/dL (ref 30.0–36.0)
MCV: 87.7 fL (ref 80.0–100.0)
Platelets: 181 10*3/uL (ref 150–400)
RBC: 4.81 MIL/uL (ref 4.22–5.81)
RDW: 11.8 % (ref 11.5–15.5)
WBC: 4.8 10*3/uL (ref 4.0–10.5)
nRBC: 0 % (ref 0.0–0.2)

## 2023-12-08 LAB — TROPONIN I (HIGH SENSITIVITY): Troponin I (High Sensitivity): 3 ng/L (ref <18)

## 2023-12-08 NOTE — ED Notes (Signed)
 To x-ray

## 2023-12-08 NOTE — Discharge Instructions (Addendum)
 Your workup today is reassuring. It is very unlikely that your pain is due to an issue in your heart.  Your cardiac enzyme (troponin) was normal today. Your EKG which is a measure of the heart's electrical activity and rhythm is normal today. These would both show abnormalities if you were having a heart attack.  It is very unlikely you have a blood clot as your PERC score is 0. This indicates a blood clot is very unlikely and does not warrant any further tests today.  Your chest x-ray is normal today.  Please follow-up with your PCP within the next week regarding your symptoms today.  They may recommend further evaluation by cardiologist if your pain is persisting.   Return to the ER if you have any shortness of breath, difficulty breathing, worsening chest pain, dizziness, jaw pain, left arm or shoulder pain, abdominal pain, unexplained fever, any other new or concerning symptoms.

## 2023-12-08 NOTE — ED Provider Notes (Signed)
 [place] [facility] Provider Note   CSN: [id number] Arrival date & time: 12/08/23  1126     History  Chief Complaint  Patient presents with   Chest Pain    Adrian Anderson is a 48 y.o. male with no significant past medical history presents with concern for pain in the left side of his chest and around his left shoulder that has been ongoing for the past 3 days.  States that this started when on his bike ride 3 days ago, and has been constant since then.  Pain is not associated with exertion, does not worsen with inspiration. No pain radiating into the back.  Denies any feelings of palpitations, dizziness, or shortness of breath.  Denies any pain or swelling in his lower extremities, recent long plane or car rides, recent surgeries or hospitalizations, or any history of blood clot.   Chest Pain      Home Medications Prior to Admission medications   Medication Sig Start Date End Date Taking? Authorizing Provider  acetaminophen (TYLENOL) 325 MG tablet Take 650 mg by mouth every 6 (six) hours as needed.    [provider]  hydrocortisone (ANUSOL-HC) 2.5 % rectal cream Place 1 Application rectally 2 (two) times daily. 03/06/22   Imogene Burn, MD  ibuprofen (ADVIL) 400 MG tablet Take 400 mg by mouth every 6 (six) hours as needed.    [provider]  omeprazole (PRILOSEC) 10 MG capsule Take 1 capsule by mouth daily as needed.    [provider]  tadalafil (CIALIS) 5 MG tablet TAKE ONE TO TWO TABLETS BY MOUTH EVERY OTHER DAY AS NEEDED 09/08/21   Everrett Coombe, DO      Allergies    Dairy aid [tilactase]    Review of Systems   Review of Systems  Cardiovascular:  Positive for chest pain.    Physical Exam Updated Vital Signs BP (!) 126/100   Pulse 71   Temp 97.7 F (36.5 C)   Resp 20   Wt 97.5 kg   SpO2 94%   BMI 26.17 kg/m  Physical Exam Vitals and nursing note reviewed.  Constitutional:      General: He is  not in acute distress.    Appearance: He is well-developed.  HENT:     Head: Normocephalic and atraumatic.  Eyes:     Conjunctiva/sclera: Conjunctivae normal.  Cardiovascular:     Rate and Rhythm: Normal rate and regular rhythm.     Heart sounds: No murmur heard.    Comments: Radial pulses 2+ bilaterally Pulmonary:     Effort: Pulmonary effort is normal. No respiratory distress.     Breath sounds: Normal breath sounds.  Abdominal:     Palpations: Abdomen is soft.     Tenderness: There is no abdominal tenderness.  Musculoskeletal:        General: No swelling.     Cervical back: Neck supple.     Comments: No lower extremity edema bilaterally.  No calf tenderness to palpation  Skin:    General: Skin is warm and dry.     Capillary Refill: Capillary refill takes less than 2 seconds.  Neurological:     Mental Status: He is alert.  Psychiatric:        Mood and Affect: Mood normal.     ED Results / Procedures / Treatments   Labs (all labs ordered are listed, but only abnormal results are displayed) Labs Reviewed  BASIC METABOLIC PANEL WITH GFR -  Abnormal; Notable for the following components:      Result Value   Sodium 134 (*)    BUN 22 (*)    All other components within normal limits  CBC  TROPONIN I (HIGH SENSITIVITY)    EKG EKG Interpretation Date/Time:  Wednesday December 08 2023 11:33:40 EDT Ventricular Rate:  74 PR Interval:  187 QRS Duration:  93 QT Interval:  393 QTC Calculation: 436 R Axis:   66  Text Interpretation: Sinus rhythm Confirmed by Anders Simmonds [phone]) on 12/08/2023 12:41:34 PM  Radiology DG Chest 2 View Result Date: 12/08/2023 CLINICAL DATA:  Chest pain EXAM: CHEST - 2 VIEW COMPARISON:  Chest x-ray 06/10/2016 FINDINGS: No consolidation, pneumothorax or effusion. No edema. Normal cardiopericardial silhouette. Overlapping cardiac leads. IMPRESSION: No acute cardiopulmonary disease. Electronically Signed   By: Karen Kays M.D.   On: 12/08/2023 14:46     Procedures Procedures    Medications Ordered in ED Medications - No data to display  ED Course/ Medical Decision Making/ A&P             HEART Score: 2                    Medical Decision Making Amount and/or Complexity of Data Reviewed Labs: ordered. Radiology: ordered.     Differential diagnosis includes but is not limited to ACS, arrhythmia, aortic aneurysm, pericarditis, myocarditis, pericardial effusion, cardiac tamponade, musculoskeletal pain, GERD, Boerhaave's syndrome, DVT/PE, pneumonia, pleural effusion   ED Course:  Upon initial evaluation, patient well-appearing, not diaphoretic, stable vital signs.  Lungs clear to auscultation bilaterally.  Regular rate and rhythm on cardiac auscultation.   Labs Ordered: I Ordered, and personally interpreted labs.  The pertinent results include:   Troponin of 3 CBC within normal limits BMP with hyponatremia at 134, no other electrolyte abnormalities.  Creatinine within normal limits  Imaging Studies ordered: I ordered imaging studies including chest x ray  I independently visualized the imaging with scope of interpretation limited to determining acute life threatening conditions related to emergency care. Imaging showed no acute abnormalities I agree with the radiologist interpretation   Cardiac Monitoring: / EKG: The patient was maintained on a cardiac monitor.  I personally viewed and interpreted the cardiac monitored which showed an underlying rhythm of: Normal sinus rhythm, no ST changes   Upon re-evaluation, patient still well-appearing, stable vital signs.   Low concern for ACS at this time given troponin remains stable with initial troponin of 3 and pain has been ongoing for 3 days, pain non-exertional, and EKG with normal sinus rhythm and no ST changes. HEART score of 2 due to reported risk factor of high cholesterol. Chest x-ray without any acute abnormality. No concern for DVT or PE at this time given PERC  negative Suspect pain is most likely musculoskeletal.  Appropriate for discharge home.    Impression: Chest wall pain  Disposition:  The patient was discharged home with instructions to follow-up with PCP if symptoms not improving within the next week. Return precautions given.     This chart was dictated using voice recognition software, Dragon. Despite the best efforts of this provider to proofread and correct errors, errors may still occur which can change documentation meaning.          Final Clinical Impression(s) / ED Diagnoses Final diagnoses:  Chest pain, unspecified type    Rx / DC Orders ED Discharge Orders     None  Rexie Catena, PA-C 12/08/23 1506    Afton Horse T, DO 12/09/23 0720

## 2023-12-08 NOTE — ED Triage Notes (Addendum)
 Left chest pain radiating to left arm x 3 days while he was biking  , worse today . Denies shortness of breath . No further symptoms

## 2023-12-17 ENCOUNTER — Encounter: Payer: Self-pay | Admitting: Family Medicine

## 2023-12-17 DIAGNOSIS — R079 Chest pain, unspecified: Secondary | ICD-10-CM

## 2023-12-17 DIAGNOSIS — R29818 Other symptoms and signs involving the nervous system: Secondary | ICD-10-CM

## 2023-12-24 ENCOUNTER — Telehealth: Payer: Self-pay | Admitting: Family Medicine

## 2023-12-24 NOTE — Telephone Encounter (Signed)
Faxed, thanks

## 2023-12-24 NOTE — Telephone Encounter (Unsigned)
 Copied from CRM [id number]. Topic: Referral - Question >> Dec 24, 2023  3:06 PM Eleanore Grey wrote: Reason for CRM: [place] with [facility] for patient's sleep medicine referral was calling to request insurance information be refaxed over for patient, says they only received demographic sheet. Fax number was confirmed, [phone].

## 2023-12-27 NOTE — Addendum Note (Signed)
 Addended by: Ioma Chismar E on: 12/27/2023 04:34 PM   Modules accepted: Orders

## 2024-01-21 ENCOUNTER — Ambulatory Visit: Payer: Self-pay | Admitting: Family Medicine

## 2024-03-01 ENCOUNTER — Other Ambulatory Visit: Payer: Self-pay

## 2024-03-01 DIAGNOSIS — K219 Gastro-esophageal reflux disease without esophagitis: Secondary | ICD-10-CM | POA: Insufficient documentation

## 2024-03-02 ENCOUNTER — Ambulatory Visit: Attending: Cardiology | Admitting: Cardiology

## 2024-03-02 ENCOUNTER — Encounter: Payer: Self-pay | Admitting: Cardiology

## 2024-03-02 VITALS — BP 136/70 | HR 77 | Ht 76.0 in | Wt 223.1 lb

## 2024-03-02 DIAGNOSIS — R0789 Other chest pain: Secondary | ICD-10-CM | POA: Diagnosis not present

## 2024-03-02 DIAGNOSIS — E785 Hyperlipidemia, unspecified: Secondary | ICD-10-CM

## 2024-03-02 DIAGNOSIS — R1013 Epigastric pain: Secondary | ICD-10-CM

## 2024-03-02 DIAGNOSIS — R079 Chest pain, unspecified: Secondary | ICD-10-CM | POA: Diagnosis not present

## 2024-03-02 DIAGNOSIS — R072 Precordial pain: Secondary | ICD-10-CM

## 2024-03-02 MED ORDER — METOPROLOL TARTRATE 100 MG PO TABS: ORAL_TABLET | ORAL | 0 refills | Status: AC

## 2024-03-02 NOTE — Patient Instructions (Addendum)
 Medication Instructions:  Take: Metoprolol  100mg  1 tablet 2 hours prior to CT scan   Lab Work: None Ordered If you have labs (blood work) drawn today and your tests are completely normal, you will receive your results only by: MyChart Message (if you have MyChart) OR A paper copy in the mail If you have any lab test that is abnormal or we need to change your treatment, we will call you to review the results.   Testing/Procedures:   Your cardiac CT will be scheduled at one of the below locations:     [facility] [address] [place], [place] [address] [phone]  If scheduled at [facility], please arrive 30 minutes early for check-in and test prep.  Please follow these instructions carefully (unless otherwise directed):  An IV will be required for this test and Nitroglycerin will be given.  Hold all erectile dysfunction medications at least 3 days (72 hrs) prior to test. (Ie viagra, cialis , sildenafil, tadalafil , etc)   On the Night Before the Test: Be sure to Drink plenty of water. Do not consume any caffeinated/decaffeinated beverages or chocolate 12 hours prior to your test. Do not take any antihistamines 12 hours prior to your test.   On the Day of the Test: Drink plenty of water until 1 hour prior to the test. Do not eat any food 1 hour prior to test. You may take your regular medications prior to the test.  Take metoprolol  (Lopressor ) two hours prior to test.        After the Test: Drink plenty of water. After receiving IV contrast, you may experience a mild flushed feeling. This is normal. On occasion, you may experience a mild rash up to 24 hours after the test. This is not dangerous. If this occurs, you can take Benadryl 25 mg, Zyrtec, Claritin, or Allegra and increase your fluid intake. (Patients taking Tikosyn should avoid Benadryl, and may take Zyrtec, Claritin, or Allegra) If you experience trouble breathing, this can be  serious. If it is severe call 911 IMMEDIATELY. If it is mild, please call our office.  We will call to schedule your test 2-4 weeks out understanding that some insurance companies will need an authorization prior to the service being performed.   For more information and frequently asked questions, please visit our website : [email or web address]  For non-scheduling related questions, please contact the cardiac imaging nurse navigator should you have any questions/concerns: Cardiac Imaging Nurse Navigators Direct Office Dial: [phone]   For scheduling needs, including cancellations and rescheduling, please call [place], [phone].    Follow-Up: At [facility], you and your health needs are our priority.  As part of our continuing mission to provide you with exceptional heart care, we have created designated Provider Care Teams.  These Care Teams include your primary Cardiologist (physician) and Advanced Practice Providers (APPs -  Physician Assistants and Nurse Practitioners) who all work together to provide you with the care you need, when you need it.  We recommend signing up for the patient portal called MyChart.  Sign up information is provided on this After Visit Summary.  MyChart is used to connect with patients for Virtual Visits (Telemedicine).  Patients are able to view lab/test results, encounter notes, upcoming appointments, etc.  Non-urgent messages can be sent to your provider as well.   To learn more about what you can do with MyChart, go to [email or web address].    Your next appointment:   2 month(s)  The format for your next appointment:   In Person  Provider:   Lamar Fitch, MD    Other Instructions NA

## 2024-03-02 NOTE — Progress Notes (Signed)
 Cardiology Consultation:    Date:  03/02/2024   ID:  Adrian Anderson, DOB 1976-02-11, MRN [id number]  PCP:  Alvia Bring, DO  Cardiologist:  Lamar Fitch, MD   Referring MD: Alvia Bring, DO   No chief complaint on file.   History of Present Illness:    Adrian Anderson is a 48 y.o. male who is being seen today for the evaluation of chest pain at the request of Alvia Bring, DO.  Past medical history significant for dyslipidemia, gastroesophageal reflux disease he was referred to me because of chest pain.  He is very active he rides bikes on the regular basis he runs also a few times after riding a bike after running he will develop some chest sensation in April the sensation lasted for few weeks was slightly worse with taking deep breath of coughing.  He slows down overall with exercises and the multiple reasons for that 1 is the fact that he about the house and there is a lot of issues that needed to be fixed in the house.  But overall generally doing good.  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   hydrocortisone  (ANUSOL -HC) 2.5 % rectal cream Place 1 Application rectally 2 (two) times daily.   ibuprofen (ADVIL) 400 MG tablet  Take 400 mg by mouth every 6 (six) hours as needed for mild pain (pain score 1-3), headache or moderate pain (pain score 4-6).   omeprazole (PRILOSEC) 10 MG capsule Take 1 capsule by mouth daily as needed.   tadalafil  (CIALIS ) 5 MG tablet TAKE ONE TO TWO TABLETS BY MOUTH EVERY OTHER DAY AS NEEDED     Allergies:   Dairy aid [tilactase]   Social History   Socioeconomic History   Marital status: Married    Spouse name: Not on file   Number of children: Not on file   Years of education: Not on file   Highest education level: Master's degree (e.g., MA, MS, MEng, MEd, MSW, MBA)  Occupational History   Not on file  Tobacco Use   Smoking status: Some Days   Smokeless tobacco: Never   Tobacco comments:    A few times per year  Vaping Use   Vaping status: Never Used  Substance and Sexual Activity   Alcohol use: Yes    Alcohol/week: 2.0 standard drinks of alcohol    Types: 2 Cans of beer per week    Comment: occasionally wine or liquor   Drug use: Never   Sexual activity: Yes    Partners: Female  Other Topics Concern   Not on file  Social History Narrative   Not on file   Social Drivers of Health   Financial Resource Strain: Low  Risk  (11/23/2023)   Overall Financial Resource Strain (CARDIA)    Difficulty of Paying Living Expenses: Not hard at all  Food Insecurity: No Food Insecurity (11/23/2023)   Hunger Vital Sign    Worried About Running Out of Food in the Last Year: Never true    Ran Out of Food in the Last Year: Never true  Transportation Needs: No Transportation Needs (11/23/2023)   PRAPARE - Administrator, Civil Service (Medical): No    Lack of Transportation (Non-Medical): No  Physical Activity: Sufficiently Active (11/23/2023)   Exercise Vital Sign    Days of Exercise per Week: 2 days    Minutes of Exercise per Session: 90 min  Stress: Stress Concern Present (11/23/2023)   [employer] of Occupational Health - Occupational Stress Questionnaire    Feeling  of Stress : Rather much  Social Connections: Moderately Isolated (11/23/2023)   Social Connection and Isolation Panel    Frequency of Communication with Friends and Family: More than three times a week    Frequency of Social Gatherings with Friends and Family: Once a week    Attends Religious Services: Never    Database administrator or Organizations: No    Attends Engineer, structural: Not on file    Marital Status: Married     Family History: The patient's family history includes Cancer in his father; Hypertension in his mother; Stomach cancer in his father. There is no history of Colon cancer, Esophageal cancer, or Rectal cancer. ROS:   Please see the history of present illness.    All 14 point review of systems negative except as described per history of present illness.  EKGs/Labs/Other Studies Reviewed:    The following studies were reviewed today:   EKG:       Recent Labs: 11/25/2023: ALT 22; TSH 1.460 12/08/2023: BUN 22; Creatinine, Ser 1.17; Hemoglobin 14.9; Platelets 181; Potassium 4.0; Sodium 134  Recent Lipid Panel    Component Value Date/Time   CHOL 297 (H) 11/25/2023 1125   TRIG 197 (H) 11/25/2023 1125   HDL 48 11/25/2023 1125   CHOLHDL 4.0 08/29/2020 0838   VLDL 30 06/19/2016 0811   LDLCALC 211 (H) 11/25/2023 1125   LDLCALC 136 (H) 08/29/2020 9161    Physical Exam:    VS:  BP 136/70   Pulse 77   Ht 6' 4 (1.93 m)   Wt 223 lb 1.3 oz (101.2 kg)   SpO2 94%   BMI 27.15 kg/m     Wt Readings from Last 3 Encounters:  03/02/24 223 lb 1.3 oz (101.2 kg)  12/08/23 215 lb (97.5 kg)  11/25/23 218 lb (98.9 kg)     GEN:  Well nourished, well developed in no acute distress HEENT: Normal NECK: No JVD; No carotid bruits LYMPHATICS: No lymphadenopathy CARDIAC: RRR, no murmurs, no rubs, no gallops RESPIRATORY:  Clear to auscultation without rales, wheezing or rhonchi  ABDOMEN: Soft, non-tender, non-distended MUSCULOSKELETAL:  No edema; No deformity  SKIN:  Warm and dry NEUROLOGIC:  Alert and oriented x 3 PSYCHIATRIC:  Normal affect   ASSESSMENT:    1. Chest pain, unspecified type   2. Atypical chest pain   3. Dyslipidemia   4. Epigastric pain    PLAN:    In order of problems listed above:  Chest pain somewhat atypical characteristic but significant risk factors movement namely poorly controlled cholesterol.  I will ask him to have coronary CT angio make sure he does not have  any obstructive disease.  Procedure was explained to him including all risk benefits. Dyslipidemia from the beginning I wanted to put him on cholesterol medication he is reluctant I get multiple reasons why he does not want to take the medication.  Will do coronary CT angio and that we will decide.  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 Epigastric pain again we will do coronary CT angio   Medication Adjustments/Labs and Tests Ordered: Current medicines are reviewed at length with the patient today.  Concerns regarding medicines are outlined above.  Orders Placed This Encounter  Procedures   EKG 12-Lead   No orders of the defined types were placed in this encounter.   Signed, Lamar DOROTHA Fitch, MD, [facility]. 03/02/2024 2:27 PM    [facility]

## 2024-03-11 ENCOUNTER — Encounter (HOSPITAL_COMMUNITY): Payer: Self-pay

## 2024-03-14 ENCOUNTER — Ambulatory Visit (HOSPITAL_BASED_OUTPATIENT_CLINIC_OR_DEPARTMENT_OTHER)

## 2024-03-16 ENCOUNTER — Encounter (HOSPITAL_COMMUNITY): Payer: Self-pay

## 2024-03-21 ENCOUNTER — Ambulatory Visit (HOSPITAL_BASED_OUTPATIENT_CLINIC_OR_DEPARTMENT_OTHER)
Admission: RE | Admit: 2024-03-21 | Discharge: 2024-03-21 | Disposition: A | Discharge status: Home / Self Care | Source: Ambulatory Visit | Attending: Cardiology | Admitting: Cardiology

## 2024-03-21 DIAGNOSIS — R072 Precordial pain: Secondary | ICD-10-CM | POA: Insufficient documentation

## 2024-03-21 MED ORDER — IOHEXOL 350 MG/ML SOLN
100.0000 mL | Freq: Once | INTRAVENOUS | Status: AC | PRN
  Administered 2024-03-21: 95 mL via INTRAVENOUS

## 2024-03-21 MED ORDER — NITROGLYCERIN 0.4 MG SL SUBL
0.8000 mg | SUBLINGUAL_TABLET | Freq: Once | SUBLINGUAL | Status: AC
  Administered 2024-03-21: 0.8 mg via SUBLINGUAL

## 2024-03-23 ENCOUNTER — Ambulatory Visit: Payer: Self-pay | Admitting: Cardiology

## 2024-05-11 ENCOUNTER — Ambulatory Visit: Admitting: Cardiology

## 2024-05-25 ENCOUNTER — Ambulatory Visit: Attending: Cardiology | Admitting: Cardiology

## 2024-05-25 ENCOUNTER — Encounter: Payer: Self-pay | Admitting: Cardiology

## 2024-05-25 ENCOUNTER — Ambulatory Visit: Admitting: Cardiology

## 2024-05-25 VITALS — BP 122/82 | HR 66 | Ht 76.0 in | Wt 212.8 lb

## 2024-05-25 DIAGNOSIS — E559 Vitamin D deficiency, unspecified: Secondary | ICD-10-CM | POA: Diagnosis not present

## 2024-05-25 DIAGNOSIS — R1013 Epigastric pain: Secondary | ICD-10-CM | POA: Diagnosis not present

## 2024-05-25 DIAGNOSIS — E785 Hyperlipidemia, unspecified: Secondary | ICD-10-CM

## 2024-05-25 DIAGNOSIS — R5383 Other fatigue: Secondary | ICD-10-CM | POA: Diagnosis not present

## 2024-05-25 NOTE — Progress Notes (Addendum)
 Cardiology Office Note:    Date:  05/25/2024   ID:  Adrian Anderson, DOB 1975/10/30, MRN [id number]  PCP:  Alvia Bring, DO  Cardiologist:  Lamar Fitch, MD    Referring MD: Alvia Bring, DO   No chief complaint on file. Doing fine    History of Present Illness:    Adrian Anderson is a 48 y.o. male past medical history significant for dyslipidemia with LDL of 211, GERD, fatigue, he came to me because of atypical chest pain.  Coronary CT angio has been done coronary CT angio showed calcium score 0 no coronary artery disease which is [place] surprising with his very high cholesterol.  He is doing well he still exercise on the regular basis he is working on finishing his house however describes he fell because lately being weak tired and fatigued.  Denies having any recent chest pain tightness squeezing pressure burning chest.  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   hydrocortisone  (ANUSOL -HC) 2.5 % rectal cream Place 1 Application rectally 2 (two) times daily.   ibuprofen (ADVIL) 400 MG tablet Take 400 mg by mouth every 6 (six) hours as needed for mild pain (pain score 1-3), headache or moderate pain (pain score 4-6).   metoprolol   tartrate (LOPRESSOR ) 100 MG tablet Take one tablet 2 hours before cardiac CT for heart greater than 55   omeprazole (PRILOSEC) 10 MG capsule Take 1 capsule by mouth daily as needed.   tadalafil  (CIALIS ) 5 MG tablet TAKE ONE TO TWO TABLETS BY MOUTH EVERY OTHER DAY AS NEEDED     Allergies:   Dairy aid [tilactase]   Social History   Socioeconomic History   Marital status: Married    Spouse name: Not on file   Number of children: Not on file   Years of education: Not on file   Highest education level: Master's degree (e.g., MA, MS, MEng, MEd, MSW, MBA)  Occupational History   Not on file  Tobacco Use   Smoking status: Some Days   Smokeless tobacco: Never   Tobacco comments:    A few times per year  Vaping Use   Vaping status: Never Used  Substance and Sexual Activity   Alcohol use: Yes    Alcohol/week: 2.0 standard drinks of alcohol    Types: 2 Cans of beer per week    Comment: occasionally wine or liquor   Drug use: Never   Sexual activity: Yes    Partners: Female  Other Topics Concern   Not on file  Social History Narrative   Not on file   Social Drivers of Health   Financial Resource Strain: Low Risk  (11/23/2023)   Overall Physicist, medical Strain (  CARDIA)    Difficulty of Paying Living Expenses: Not hard at all  Food Insecurity: No Food Insecurity (11/23/2023)   Hunger Vital Sign    Worried About Running Out of Food in the Last Year: Never true    Ran Out of Food in the Last Year: Never true  Transportation Needs: No Transportation Needs (11/23/2023)   PRAPARE - Administrator, Civil Service (Medical): No    Lack of Transportation (Non-Medical): No  Physical Activity: Sufficiently Active (11/23/2023)   Exercise Vital Sign    Days of Exercise per Week: 2 days    Minutes of Exercise per Session: 90 min  Stress: Stress Concern Present (11/23/2023)   [employer] of Occupational Health - Occupational Stress Questionnaire    Feeling of Stress : Rather much   Social Connections: Moderately Isolated (11/23/2023)   Social Connection and Isolation Panel    Frequency of Communication with Friends and Family: More than three times a week    Frequency of Social Gatherings with Friends and Family: Once a week    Attends Religious Services: Never    Database administrator or Organizations: No    Attends Engineer, structural: Not on file    Marital Status: Married     Family History: The patient's family history includes Cancer in his father; Hypertension in his mother; Stomach cancer in his father. There is no history of Colon cancer, Esophageal cancer, or Rectal cancer. ROS:   Please see the history of present illness.    All 14 point review of systems negative except as described per history of present illness  EKGs/Labs/Other Studies Reviewed:         Recent Labs: 11/25/2023: ALT 22; TSH 1.460 12/08/2023: BUN 22; Creatinine, Ser 1.17; Hemoglobin 14.9; Platelets 181; Potassium 4.0; Sodium 134  Recent Lipid Panel    Component Value Date/Time   CHOL 297 (H) 11/25/2023 1125   TRIG 197 (H) 11/25/2023 1125   HDL 48 11/25/2023 1125   CHOLHDL 4.0 08/29/2020 0838   VLDL 30 06/19/2016 0811   LDLCALC 211 (H) 11/25/2023 1125   LDLCALC 136 (H) 08/29/2020 9161    Physical Exam:    VS:  BP 122/82   Pulse 66   Ht 6' 4 (1.93 m)   Wt 212 lb 12.8 oz (96.5 kg)   SpO2 96%   BMI 25.90 kg/m     Wt Readings from Last 3 Encounters:  05/25/24 212 lb 12.8 oz (96.5 kg)  03/02/24 223 lb 1.3 oz (101.2 kg)  12/08/23 215 lb (97.5 kg)     GEN:  Well nourished, well developed in no acute distress HEENT: Normal NECK: No JVD; No carotid bruits LYMPHATICS: No lymphadenopathy CARDIAC: RRR, no murmurs, no rubs, no gallops RESPIRATORY:  Clear to auscultation without rales, wheezing or rhonchi  ABDOMEN: Soft, non-tender, non-distended MUSCULOSKELETAL:  No edema; No deformity  SKIN: Warm and dry LOWER EXTREMITIES: no swelling NEUROLOGIC:  Alert and  oriented x 3 PSYCHIATRIC:  Normal affect   ASSESSMENT:    1. Dyslipidemia   2. Epigastric pain   3. Fatigue, unspecified type    PLAN:    In order of problems listed above:  Dyslipidemia, surprisingly he is coronary CT angio showed calcium score 0 with no coronary disease.  I had a long discussion with him about what to do with the situation, I recommended to do particle size measurements to help to determine significance of his cholesterol elevation. Epigastric pain denies having  any. Fatigue and tiredness difficulty show I ask him to check vitamin D3 as well as vitamin B12.   Medication Adjustments/Labs and Tests Ordered: Current medicines are reviewed at length with the patient today.  Concerns regarding medicines are outlined above.  No orders of the defined types were placed in this encounter.  Medication changes: No orders of the defined types were placed in this encounter.   Signed, Lamar DOROTHA Fitch, MD, [facility] 05/25/2024 11:34 AM    [facility]

## 2024-05-25 NOTE — Addendum Note (Signed)
 Addended by: ARLOA PLANAS D on: 05/25/2024 11:39 AM   Modules accepted: Orders

## 2024-05-25 NOTE — Patient Instructions (Addendum)
 Medication Instructions:  Your physician recommends that you continue on your current medications as directed. Please refer to the Current Medication list given to you today.  *If you need a refill on your cardiac medications before your next appointment, please call your pharmacy*   Lab Work: NMR, Lipid, Vitamin D3, B12- today If you have labs (blood work) drawn today and your tests are completely normal, you will receive your results only by: MyChart Message (if you have MyChart) OR A paper copy in the mail If you have any lab test that is abnormal or we need to change your treatment, we will call you to review the results.   Testing/Procedures: None Ordered   Follow-Up: At [facility], you and your health needs are our priority.  As part of our continuing mission to provide you with exceptional heart care, we have created designated Provider Care Teams.  These Care Teams include your primary Cardiologist (physician) and Advanced Practice Providers (APPs -  Physician Assistants and Nurse Practitioners) who all work together to provide you with the care you need, when you need it.  We recommend signing up for the patient portal called MyChart.  Sign up information is provided on this After Visit Summary.  MyChart is used to connect with patients for Virtual Visits (Telemedicine).  Patients are able to view lab/test results, encounter notes, upcoming appointments, etc.  Non-urgent messages can be sent to your provider as well.   To learn more about what you can do with MyChart, go to [email or web address].    Your next appointment:   6 month(s)  The format for your next appointment:   In Person  Provider:   Lamar Fitch, MD    Other Instructions NA

## 2024-06-02 DIAGNOSIS — R5383 Other fatigue: Secondary | ICD-10-CM | POA: Diagnosis not present

## 2024-06-02 DIAGNOSIS — E559 Vitamin D deficiency, unspecified: Secondary | ICD-10-CM | POA: Diagnosis not present

## 2024-06-03 LAB — VITAMIN D 25 HYDROXY (VIT D DEFICIENCY, FRACTURES): Vit D, 25-Hydroxy: 50.7 ng/mL (ref 30.0–100.0)

## 2024-06-03 LAB — NMR, LIPOPROFILE
Cholesterol, Total: 306 mg/dL — ABNORMAL HIGH (ref 100–199)
HDL Particle Number: 23.8 umol/L — ABNORMAL LOW (ref >=30.5)
HDL-C: 39 mg/dL — ABNORMAL LOW (ref >39)
LDL Particle Number: 3137 nmol/L — ABNORMAL HIGH (ref <1000)
LDL Size: 20.4 nm — ABNORMAL LOW (ref >20.5)
LDL-C (NIH Calc): 223 mg/dL — ABNORMAL HIGH (ref 0–99)
LP-IR Score: 30 (ref <=45)
Small LDL Particle Number: 2003 nmol/L — ABNORMAL HIGH (ref <=527)
Triglycerides: 222 mg/dL — ABNORMAL HIGH (ref 0–149)

## 2024-06-03 LAB — LIPID PANEL
Chol/HDL Ratio: 7.4 ratio — ABNORMAL HIGH (ref 0.0–5.0)
Cholesterol, Total: 309 mg/dL — ABNORMAL HIGH (ref 100–199)
HDL: 42 mg/dL (ref >39)
LDL Chol Calc (NIH): 225 mg/dL — ABNORMAL HIGH (ref 0–99)
Triglycerides: 211 mg/dL — ABNORMAL HIGH (ref 0–149)
VLDL Cholesterol Cal: 42 mg/dL — ABNORMAL HIGH (ref 5–40)

## 2024-06-03 LAB — B12 AND FOLATE PANEL
Folate: 16.5 ng/mL (ref >3.0)
Vitamin B-12: 676 pg/mL (ref 232–1245)

## 2024-06-19 ENCOUNTER — Ambulatory Visit: Payer: Self-pay | Admitting: Cardiology
